# Patient Record
Sex: Male | Born: 1955 | Race: White | Hispanic: No | Marital: Married | Smoking: Never smoker
Health system: Southern US, Community
[De-identification: ages and names within clinical notes are randomized; demographics above are authoritative.]

## PROBLEM LIST (undated history)

## (undated) DIAGNOSIS — E559 Vitamin D deficiency, unspecified: Secondary | ICD-10-CM

## (undated) DIAGNOSIS — Z8709 Personal history of other diseases of the respiratory system: Secondary | ICD-10-CM

## (undated) DIAGNOSIS — M545 Low back pain, unspecified: Secondary | ICD-10-CM

## (undated) DIAGNOSIS — M1990 Unspecified osteoarthritis, unspecified site: Secondary | ICD-10-CM

## (undated) DIAGNOSIS — Z9889 Other specified postprocedural states: Secondary | ICD-10-CM

## (undated) DIAGNOSIS — N401 Enlarged prostate with lower urinary tract symptoms: Secondary | ICD-10-CM

## (undated) DIAGNOSIS — E785 Hyperlipidemia, unspecified: Secondary | ICD-10-CM

## (undated) DIAGNOSIS — Z860100 Personal history of colon polyps, unspecified: Secondary | ICD-10-CM

## (undated) DIAGNOSIS — M5416 Radiculopathy, lumbar region: Secondary | ICD-10-CM

## (undated) DIAGNOSIS — E049 Nontoxic goiter, unspecified: Secondary | ICD-10-CM

## (undated) DIAGNOSIS — R200 Anesthesia of skin: Secondary | ICD-10-CM

## (undated) DIAGNOSIS — K4090 Unilateral inguinal hernia, without obstruction or gangrene, not specified as recurrent: Secondary | ICD-10-CM

## (undated) DIAGNOSIS — T4145XA Adverse effect of unspecified anesthetic, initial encounter: Secondary | ICD-10-CM

## (undated) DIAGNOSIS — M7520 Bicipital tendinitis, unspecified shoulder: Secondary | ICD-10-CM

## (undated) DIAGNOSIS — K6289 Other specified diseases of anus and rectum: Secondary | ICD-10-CM

## (undated) DIAGNOSIS — R202 Paresthesia of skin: Secondary | ICD-10-CM

## (undated) DIAGNOSIS — T8859XA Other complications of anesthesia, initial encounter: Secondary | ICD-10-CM

## (undated) DIAGNOSIS — R112 Nausea with vomiting, unspecified: Secondary | ICD-10-CM

## (undated) DIAGNOSIS — L0292 Furuncle, unspecified: Secondary | ICD-10-CM

## (undated) DIAGNOSIS — M7541 Impingement syndrome of right shoulder: Secondary | ICD-10-CM

## (undated) HISTORY — PX: COLONOSCOPY: SHX174

## (undated) HISTORY — PX: INGUINAL HERNIA REPAIR: SUR1180

---
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
## 2001-11-27 HISTORY — PX: THYROIDECTOMY, PARTIAL: SHX18

## 2018-02-21 ENCOUNTER — Ambulatory Visit (INDEPENDENT_AMBULATORY_CARE_PROVIDER_SITE_OTHER): Payer: Self-pay | Admitting: Orthopaedic Surgery

## 2018-02-21 ENCOUNTER — Encounter (INDEPENDENT_AMBULATORY_CARE_PROVIDER_SITE_OTHER): Payer: Self-pay | Admitting: Orthopaedic Surgery

## 2018-02-21 DIAGNOSIS — M1611 Unilateral primary osteoarthritis, right hip: Secondary | ICD-10-CM | POA: Diagnosis not present

## 2018-02-21 DIAGNOSIS — M25551 Pain in right hip: Secondary | ICD-10-CM | POA: Diagnosis not present

## 2018-02-21 NOTE — Progress Notes (Signed)
Office Visit Note   Patient: Seth Wood           Date of Birth: [birth year]           MRN: [id number] Visit Date: 02/21/2018              Requested by: No referring provider defined for this encounter. PCP: Katherine Basset, PA-C   Assessment & Plan: Visit Diagnoses:  1. Unilateral primary osteoarthritis, right hip   2. Pain of right hip joint     Plan: At this point I agree that he is failed all conservative treatment measures and his arthritis is severe that I would recommend hip replacement surgery.  I talked about anterior hip replacement surgery and gave him a handout about it.  I showed him hip models and went over in detail with the surgery involves including long and thorough discussion the risk and benefits of surgery and what his intraoperative and postoperative course involved.  All questions and concerns were answered and addressed.  I gave him our surgery schedulers card so he will call us when he decides to have this scheduled.   Follow-Up Instructions: Return for 2 weeks post-op.   Orders:  No orders of the defined types were placed in this encounter.  No orders of the defined types were placed in this encounter.     Procedures: No procedures performed   Clinical Data: No additional findings.   Subjective: Chief Complaint  Patient presents with  . Right Hip - Pain  Patient is a very pleasant 62 year old gentleman with severe debilitating hip pain in his right hip.  He is been hurting for many years now and is been seeing another orthopedic surgeon who is recommended hip replacement surgery but he wants this done through an anterior approach and that surgeon on his posterior hip surgery.  He is been told he has bone-on-bone wear.  His pain is daily and is detrimentally affect is active daily living his quality of life and his mobility.  It is in the groin.  It hurts when he sleeps at night when he first gets up to walk on it.  He works in the school system  and would like to have surgery done in early June.  He is been on meloxicam daily and that helps take the edge off of things.  It is severe pain.  It is 10 out of 10.  He is tried activity modification and weight loss.  He is on anti-inflammatories.  Again this is been hurting for many years now.  X-rays accompany him today.  He is tried a cane on occasion as well.  He is not a diabetic and not a smoker.  HPI  Review of Systems He is alert and oriented x3 and in no acute distress.  He denies any headache, chest pain, shortness of breath, fever, chills, nausea, vomiting.  Objective: Vital Signs: There were no vitals taken for this visit.  Physical Exam  Ortho Exam He walks with a Trendelenburg gait.  He has significant stiffness with internal/external rotation of the right hip I can barely move it.  His pain is severe in the groin.  He has better range of motion of his left hip but there is pain in the groin on the left hip as well. Specialty Comments:  No specialty comments available.  Imaging: No results found. X-rays that accompany him that are independently reviewed shows severe end-stage arthritis of the right hip and mild to moderate  arthritis left hip.  The right hip has complete obliteration of loss of the joint space.  It is the essential absence of bone-on-bone wear.  There are particular osteophytes and severe sclerotic and cystic changes on both the femoral head and acetabular side of things and again he cannot even see the joint space.  PMFS History: Patient Active Problem List   Diagnosis Date Noted  . Unilateral primary osteoarthritis, right hip 02/21/2018   History reviewed. No pertinent past medical history.  History reviewed. No pertinent family history.  History reviewed. No pertinent surgical history. Social History   Occupational History  . Not on file  Tobacco Use  . Smoking status: Not on file  Substance and Sexual Activity  . Alcohol use: Not on file  .  Drug use: Not on file  . Sexual activity: Not on file

## 2018-03-07 ENCOUNTER — Other Ambulatory Visit (INDEPENDENT_AMBULATORY_CARE_PROVIDER_SITE_OTHER): Payer: Self-pay

## 2018-03-11 NOTE — Pre-Procedure Instructions (Signed)
Seth GowerBenny R Wood  03/11/2018      [facility] [address] - [place], [place] - [address], [id number], [address] [place] [id number] Phone: [phone] Fax: [phone]    Your procedure is scheduled on April 23  [facility] at 1200 P.M.  Call this number if you have problems the morning of surgery:  [phone]   Remember:  Do not eat food or drink liquids after midnight.  Continue all medications as directed by your physician except follow these medication instructions before surgery below   Take these medicines the morning of surgery with A SIP OF WATER NONE  7 days prior to surgery STOP taking any Aspirin(unless otherwise instructed by your surgeon), Aleve, Naproxen, Ibuprofen, Motrin, Advil, Goody's, BC's, all herbal medications, fish oil, and all vitamins, meloxicam (MOBIC)     Do not wear jewelry  Do not wear lotions, powders, or cologne, or deodorant.  Men may shave face and neck.  Do not bring valuables to the hospital.  [facility] is not [facility] for any belongings or valuables.  Contacts, dentures or bridgework may not be worn into surgery.  Leave your suitcase in the car.  After surgery it may be brought to your room.  For patients admitted to the hospital, discharge time will be determined by your treatment team.  Patients discharged the day of surgery will not be allowed to drive home.    Special instructions:   [facility]- Preparing For Surgery  Before surgery, you can play an important role. Because skin is not sterile, your skin needs to be as free of germs as possible. You can reduce the number of germs on your skin by washing with CHG (chlorahexidine gluconate) Soap before surgery.  CHG is an antiseptic cleaner which kills germs and bonds with the skin to continue killing germs even after washing.  Please do not use if you have an allergy to CHG or antibacterial soaps. If your skin becomes  reddened/irritated stop using the CHG.  Do not shave (including legs and underarms) for at least 48 hours prior to first CHG shower. It is OK to shave your face.  Please follow these instructions carefully.   1. Shower the NIGHT BEFORE SURGERY and the MORNING OF SURGERY with CHG.   2. If you chose to wash your hair, wash your hair first as usual with your normal shampoo.  3. After you shampoo, rinse your hair and body thoroughly to remove the shampoo.  4. Use CHG as you would any other liquid soap. You can apply CHG directly to the skin and wash gently with a scrungie or a clean washcloth.   5. Apply the CHG Soap to your body ONLY FROM THE NECK DOWN.  Do not use on open wounds or open sores. Avoid contact with your eyes, ears, mouth and genitals (private parts). Wash Face and genitals (private parts)  with your normal soap.  6. Wash thoroughly, paying special attention to the area where your surgery will be performed.  7. Thoroughly rinse your body with warm water from the neck down.  8. DO NOT shower/wash with your normal soap after using and rinsing off the CHG Soap.  9. Pat yourself dry with a CLEAN TOWEL.  10. Wear CLEAN PAJAMAS to bed the night before surgery, wear comfortable clothes the morning of surgery  11. Place CLEAN SHEETS on your bed the night of your first shower and DO NOT SLEEP  WITH PETS.    Day of Surgery: Do not apply any deodorants/lotions. Please wear clean clothes to the hospital/surgery center.      Please read over the following fact sheets that you were given.

## 2018-03-12 ENCOUNTER — Encounter (HOSPITAL_COMMUNITY): Payer: Self-pay

## 2018-03-12 ENCOUNTER — Encounter (HOSPITAL_COMMUNITY)
Admission: RE | Admit: 2018-03-12 | Discharge: 2018-03-12 | Disposition: A | Discharge status: Home / Self Care | Payer: BC Managed Care – PPO | Source: Ambulatory Visit | Attending: Orthopaedic Surgery | Admitting: Orthopaedic Surgery

## 2018-03-12 ENCOUNTER — Other Ambulatory Visit (INDEPENDENT_AMBULATORY_CARE_PROVIDER_SITE_OTHER): Payer: Self-pay | Admitting: Physician Assistant

## 2018-03-12 ENCOUNTER — Other Ambulatory Visit: Payer: Self-pay

## 2018-03-12 DIAGNOSIS — Z01812 Encounter for preprocedural laboratory examination: Secondary | ICD-10-CM | POA: Diagnosis present

## 2018-03-12 HISTORY — DX: Other complications of anesthesia, initial encounter: T88.59XA

## 2018-03-12 HISTORY — DX: Other specified postprocedural states: Z98.890

## 2018-03-12 HISTORY — DX: Adverse effect of unspecified anesthetic, initial encounter: T41.45XA

## 2018-03-12 HISTORY — DX: Nausea with vomiting, unspecified: R11.2

## 2018-03-12 LAB — CBC
HEMATOCRIT: 44.3 % (ref 39.0–52.0)
HEMOGLOBIN: 15.1 g/dL (ref 13.0–17.0)
MCH: 30.2 pg (ref 26.0–34.0)
MCHC: 34.1 g/dL (ref 30.0–36.0)
MCV: 88.6 fL (ref 78.0–100.0)
Platelets: 332 10*3/uL (ref 150–400)
RBC: 5 MIL/uL (ref 4.22–5.81)
RDW: 12.6 % (ref 11.5–15.5)
WBC: 8.1 10*3/uL (ref 4.0–10.5)

## 2018-03-12 LAB — SURGICAL PCR SCREEN
MRSA, PCR: NEGATIVE
Staphylococcus aureus: NEGATIVE

## 2018-03-12 NOTE — Progress Notes (Addendum)
PCP: Katherine Bassethristopher Cowen, PA-C  Cardiologist: pt denies  EKG: pt denies past year  Stress test: pt denies ever  ECHO: pt denies ever  Cardiac Cath: pt denies ever  Chest x-ray: pt denies past year, no recent respiratory infections/complications

## 2018-03-18 MED ORDER — CEFAZOLIN SODIUM-DEXTROSE 2-4 GM/100ML-% IV SOLN
2.0000 g | INTRAVENOUS | Status: AC
  Administered 2018-03-19: 2 g via INTRAVENOUS
  Filled 2018-03-18: qty 100

## 2018-03-18 MED ORDER — TRANEXAMIC ACID 1000 MG/10ML IV SOLN
1000.0000 mg | INTRAVENOUS | Status: AC
  Administered 2018-03-19: 1000 mg via INTRAVENOUS
  Filled 2018-03-18: qty 1100

## 2018-03-19 ENCOUNTER — Inpatient Hospital Stay (HOSPITAL_COMMUNITY): Payer: BC Managed Care – PPO

## 2018-03-19 ENCOUNTER — Telehealth (INDEPENDENT_AMBULATORY_CARE_PROVIDER_SITE_OTHER): Payer: Self-pay | Admitting: Radiology

## 2018-03-19 ENCOUNTER — Inpatient Hospital Stay (HOSPITAL_COMMUNITY): Payer: BC Managed Care – PPO | Admitting: Certified Registered"

## 2018-03-19 ENCOUNTER — Other Ambulatory Visit: Payer: Self-pay

## 2018-03-19 ENCOUNTER — Encounter (HOSPITAL_COMMUNITY): Payer: Self-pay | Admitting: *Deleted

## 2018-03-19 ENCOUNTER — Encounter (HOSPITAL_COMMUNITY)
Admission: RE | Disposition: A | Discharge status: Home Health | Payer: Self-pay | Source: Ambulatory Visit | Attending: Orthopaedic Surgery

## 2018-03-19 ENCOUNTER — Inpatient Hospital Stay (HOSPITAL_COMMUNITY)
Admission: RE | Admit: 2018-03-19 | Discharge: 2018-03-21 | DRG: 470 | Disposition: A | Discharge status: Home Health | Payer: BC Managed Care – PPO | Source: Ambulatory Visit | Attending: Orthopaedic Surgery | Admitting: Orthopaedic Surgery

## 2018-03-19 DIAGNOSIS — E669 Obesity, unspecified: Secondary | ICD-10-CM | POA: Diagnosis present

## 2018-03-19 DIAGNOSIS — Z419 Encounter for procedure for purposes other than remedying health state, unspecified: Secondary | ICD-10-CM

## 2018-03-19 DIAGNOSIS — Z6835 Body mass index (BMI) 35.0-35.9, adult: Secondary | ICD-10-CM | POA: Diagnosis not present

## 2018-03-19 DIAGNOSIS — M1611 Unilateral primary osteoarthritis, right hip: Secondary | ICD-10-CM | POA: Diagnosis present

## 2018-03-19 DIAGNOSIS — M25551 Pain in right hip: Secondary | ICD-10-CM | POA: Diagnosis present

## 2018-03-19 DIAGNOSIS — Z96641 Presence of right artificial hip joint: Secondary | ICD-10-CM

## 2018-03-19 HISTORY — PX: TOTAL HIP ARTHROPLASTY: SHX124

## 2018-03-19 HISTORY — DX: Nontoxic goiter, unspecified: E04.9

## 2018-03-19 SURGERY — ARTHROPLASTY, HIP, TOTAL, ANTERIOR APPROACH
Anesthesia: Spinal | Laterality: Right

## 2018-03-19 MED ORDER — MENTHOL 3 MG MT LOZG: 1.0000 | LOZENGE | OROMUCOSAL | Status: DC | PRN

## 2018-03-19 MED ORDER — OXYCODONE HCL 5 MG PO TABS
ORAL_TABLET | ORAL | Status: AC
  Filled 2018-03-19: qty 2

## 2018-03-19 MED ORDER — METOCLOPRAMIDE HCL 5 MG PO TABS: 5.0000 mg | ORAL_TABLET | Freq: Three times a day (TID) | ORAL | Status: DC | PRN

## 2018-03-19 MED ORDER — BUPIVACAINE IN DEXTROSE 0.75-8.25 % IT SOLN
INTRATHECAL | Status: DC | PRN
  Administered 2018-03-19: 1.8 mL via INTRATHECAL

## 2018-03-19 MED ORDER — ACETAMINOPHEN 325 MG PO TABS
325.0000 mg | ORAL_TABLET | Freq: Four times a day (QID) | ORAL | Status: DC | PRN
  Administered 2018-03-20 – 2018-03-21 (×4): 650 mg via ORAL
  Filled 2018-03-19 (×4): qty 2

## 2018-03-19 MED ORDER — ONDANSETRON HCL 4 MG/2ML IJ SOLN: 4.0000 mg | Freq: Four times a day (QID) | INTRAMUSCULAR | Status: DC | PRN

## 2018-03-19 MED ORDER — MEPERIDINE HCL 50 MG/ML IJ SOLN: 6.2500 mg | INTRAMUSCULAR | Status: DC | PRN

## 2018-03-19 MED ORDER — FENTANYL CITRATE (PF) 250 MCG/5ML IJ SOLN
INTRAMUSCULAR | Status: AC
  Filled 2018-03-19: qty 5

## 2018-03-19 MED ORDER — PROPOFOL 10 MG/ML IV BOLUS
INTRAVENOUS | Status: DC | PRN
  Administered 2018-03-19: 40 mg via INTRAVENOUS

## 2018-03-19 MED ORDER — HYDROMORPHONE HCL 2 MG/ML IJ SOLN
0.5000 mg | INTRAMUSCULAR | Status: DC | PRN
  Filled 2018-03-19: qty 1

## 2018-03-19 MED ORDER — 0.9 % SODIUM CHLORIDE (POUR BTL) OPTIME
TOPICAL | Status: DC | PRN
  Administered 2018-03-19: 1000 mL

## 2018-03-19 MED ORDER — DEXTROSE 5 % IV SOLN
INTRAVENOUS | Status: DC | PRN
  Administered 2018-03-19: 25 ug/min via INTRAVENOUS

## 2018-03-19 MED ORDER — ONDANSETRON HCL 4 MG/2ML IJ SOLN
INTRAMUSCULAR | Status: DC | PRN
  Administered 2018-03-19: 4 mg via INTRAVENOUS

## 2018-03-19 MED ORDER — OXYCODONE HCL 5 MG PO TABS
5.0000 mg | ORAL_TABLET | ORAL | Status: DC | PRN
  Administered 2018-03-19: 10 mg via ORAL

## 2018-03-19 MED ORDER — ONDANSETRON HCL 4 MG PO TABS: 4.0000 mg | ORAL_TABLET | Freq: Four times a day (QID) | ORAL | Status: DC | PRN

## 2018-03-19 MED ORDER — ASPIRIN 81 MG PO CHEW
81.0000 mg | CHEWABLE_TABLET | Freq: Two times a day (BID) | ORAL | Status: DC
  Administered 2018-03-20 – 2018-03-21 (×3): 81 mg via ORAL
  Filled 2018-03-19 (×3): qty 1

## 2018-03-19 MED ORDER — KETOROLAC TROMETHAMINE 30 MG/ML IJ SOLN
INTRAMUSCULAR | Status: AC
  Filled 2018-03-19: qty 1

## 2018-03-19 MED ORDER — KETOROLAC TROMETHAMINE 30 MG/ML IJ SOLN
30.0000 mg | Freq: Once | INTRAMUSCULAR | Status: AC | PRN
  Administered 2018-03-19: 30 mg via INTRAVENOUS

## 2018-03-19 MED ORDER — METHOCARBAMOL 1000 MG/10ML IJ SOLN
500.0000 mg | Freq: Four times a day (QID) | INTRAVENOUS | Status: DC | PRN
  Filled 2018-03-19: qty 5

## 2018-03-19 MED ORDER — ALUM & MAG HYDROXIDE-SIMETH 200-200-20 MG/5ML PO SUSP: 30.0000 mL | ORAL | Status: DC | PRN

## 2018-03-19 MED ORDER — MIDAZOLAM HCL 2 MG/2ML IJ SOLN
INTRAMUSCULAR | Status: DC | PRN
  Administered 2018-03-19: 2 mg via INTRAVENOUS

## 2018-03-19 MED ORDER — HYDROMORPHONE HCL 2 MG/ML IJ SOLN: 0.2500 mg | INTRAMUSCULAR | Status: DC | PRN

## 2018-03-19 MED ORDER — SODIUM CHLORIDE 0.9 % IV SOLN: INTRAVENOUS | Status: DC

## 2018-03-19 MED ORDER — GLYCOPYRROLATE 0.2 MG/ML IV SOSY
PREFILLED_SYRINGE | INTRAVENOUS | Status: DC | PRN
  Administered 2018-03-19: .2 mg via INTRAVENOUS

## 2018-03-19 MED ORDER — DIPHENHYDRAMINE HCL 12.5 MG/5ML PO ELIX: 12.5000 mg | ORAL_SOLUTION | ORAL | Status: DC | PRN

## 2018-03-19 MED ORDER — DOCUSATE SODIUM 100 MG PO CAPS
100.0000 mg | ORAL_CAPSULE | Freq: Two times a day (BID) | ORAL | Status: DC
  Administered 2018-03-20 – 2018-03-21 (×3): 100 mg via ORAL
  Filled 2018-03-19 (×3): qty 1

## 2018-03-19 MED ORDER — CHLORHEXIDINE GLUCONATE 4 % EX LIQD: 60.0000 mL | Freq: Once | CUTANEOUS | Status: DC

## 2018-03-19 MED ORDER — POLYETHYLENE GLYCOL 3350 17 G PO PACK: 17.0000 g | PACK | Freq: Every day | ORAL | Status: DC | PRN

## 2018-03-19 MED ORDER — MIDAZOLAM HCL 2 MG/2ML IJ SOLN
INTRAMUSCULAR | Status: AC
  Filled 2018-03-19: qty 2

## 2018-03-19 MED ORDER — SODIUM CHLORIDE 0.9 % IR SOLN
Status: DC | PRN
  Administered 2018-03-19: 3000 mL

## 2018-03-19 MED ORDER — PROMETHAZINE HCL 25 MG/ML IJ SOLN: 6.2500 mg | INTRAMUSCULAR | Status: DC | PRN

## 2018-03-19 MED ORDER — PANTOPRAZOLE SODIUM 40 MG PO TBEC
40.0000 mg | DELAYED_RELEASE_TABLET | Freq: Every day | ORAL | Status: DC
  Administered 2018-03-20 – 2018-03-21 (×2): 40 mg via ORAL
  Filled 2018-03-19 (×2): qty 1

## 2018-03-19 MED ORDER — DEXAMETHASONE SODIUM PHOSPHATE 10 MG/ML IJ SOLN
INTRAMUSCULAR | Status: DC | PRN
  Administered 2018-03-19: 10 mg via INTRAVENOUS

## 2018-03-19 MED ORDER — METHOCARBAMOL 500 MG PO TABS
500.0000 mg | ORAL_TABLET | Freq: Four times a day (QID) | ORAL | Status: DC | PRN
  Administered 2018-03-19 – 2018-03-21 (×4): 500 mg via ORAL
  Filled 2018-03-19 (×3): qty 1

## 2018-03-19 MED ORDER — PHENOL 1.4 % MT LIQD: 1.0000 | OROMUCOSAL | Status: DC | PRN

## 2018-03-19 MED ORDER — CEFAZOLIN SODIUM-DEXTROSE 2-4 GM/100ML-% IV SOLN
2.0000 g | Freq: Four times a day (QID) | INTRAVENOUS | Status: AC
  Administered 2018-03-20 (×2): 2 g via INTRAVENOUS
  Filled 2018-03-19 (×2): qty 100

## 2018-03-19 MED ORDER — METOCLOPRAMIDE HCL 5 MG/ML IJ SOLN: 5.0000 mg | Freq: Three times a day (TID) | INTRAMUSCULAR | Status: DC | PRN

## 2018-03-19 MED ORDER — PROPOFOL 500 MG/50ML IV EMUL
INTRAVENOUS | Status: DC | PRN
  Administered 2018-03-19: 16:00:00 via INTRAVENOUS
  Administered 2018-03-19: 100 ug/kg/min via INTRAVENOUS

## 2018-03-19 MED ORDER — OXYCODONE HCL 5 MG PO TABS: 10.0000 mg | ORAL_TABLET | ORAL | Status: DC | PRN

## 2018-03-19 MED ORDER — FENTANYL CITRATE (PF) 250 MCG/5ML IJ SOLN
INTRAMUSCULAR | Status: DC | PRN
  Administered 2018-03-19: 50 ug via INTRAVENOUS

## 2018-03-19 MED ORDER — LACTATED RINGERS IV SOLN
INTRAVENOUS | Status: DC
  Administered 2018-03-19 (×2): via INTRAVENOUS

## 2018-03-19 MED ORDER — METHOCARBAMOL 500 MG PO TABS
ORAL_TABLET | ORAL | Status: AC
  Filled 2018-03-19: qty 1

## 2018-03-19 SURGICAL SUPPLY — 53 items
BENZOIN TINCTURE PRP APPL 2/3 (GAUZE/BANDAGES/DRESSINGS) ×3 IMPLANT
BLADE CLIPPER SURG (BLADE) IMPLANT
BLADE SAW SGTL 18X1.27X75 (BLADE) ×2 IMPLANT
BLADE SAW SGTL 18X1.27X75MM (BLADE) ×1
CAPT HIP TOTAL 2 ×3 IMPLANT
CELLS DAT CNTRL 66122 CELL SVR (MISCELLANEOUS) ×1 IMPLANT
CLOSURE WOUND 1/2 X4 (GAUZE/BANDAGES/DRESSINGS) ×2
COVER LIGHT HANDLE STERIS (MISCELLANEOUS) ×3 IMPLANT
COVER SURGICAL LIGHT HANDLE (MISCELLANEOUS) ×3 IMPLANT
DRAPE C-ARM 42X72 X-RAY (DRAPES) ×3 IMPLANT
DRAPE STERI IOBAN 125X83 (DRAPES) ×3 IMPLANT
DRAPE U-SHAPE 47X51 STRL (DRAPES) ×9 IMPLANT
DRSG AQUACEL AG ADV 3.5X10 (GAUZE/BANDAGES/DRESSINGS) ×3 IMPLANT
DURAPREP 26ML APPLICATOR (WOUND CARE) ×3 IMPLANT
ELECT BLADE 4.0 EZ CLEAN MEGAD (MISCELLANEOUS) ×3
ELECT BLADE 6.5 EXT (BLADE) IMPLANT
ELECT REM PT RETURN 9FT ADLT (ELECTROSURGICAL) ×3
ELECTRODE BLDE 4.0 EZ CLN MEGD (MISCELLANEOUS) ×1 IMPLANT
ELECTRODE REM PT RTRN 9FT ADLT (ELECTROSURGICAL) ×1 IMPLANT
FACESHIELD WRAPAROUND (MASK) ×6 IMPLANT
GAUZE XEROFORM 1X8 LF (GAUZE/BANDAGES/DRESSINGS) ×3 IMPLANT
GLOVE BIOGEL PI IND STRL 8 (GLOVE) ×2 IMPLANT
GLOVE BIOGEL PI INDICATOR 8 (GLOVE) ×4
GLOVE ECLIPSE 8.0 STRL XLNG CF (GLOVE) ×3 IMPLANT
GLOVE ORTHO TXT STRL SZ7.5 (GLOVE) ×6 IMPLANT
GOWN STRL REUS W/ TWL LRG LVL3 (GOWN DISPOSABLE) ×2 IMPLANT
GOWN STRL REUS W/ TWL XL LVL3 (GOWN DISPOSABLE) ×2 IMPLANT
GOWN STRL REUS W/TWL LRG LVL3 (GOWN DISPOSABLE) ×4
GOWN STRL REUS W/TWL XL LVL3 (GOWN DISPOSABLE) ×4
HANDPIECE INTERPULSE COAX TIP (DISPOSABLE) ×2
KIT BASIN OR (CUSTOM PROCEDURE TRAY) ×3 IMPLANT
KIT TURNOVER KIT B (KITS) ×3 IMPLANT
MANIFOLD NEPTUNE II (INSTRUMENTS) ×3 IMPLANT
NS IRRIG 1000ML POUR BTL (IV SOLUTION) ×3 IMPLANT
PACK TOTAL JOINT (CUSTOM PROCEDURE TRAY) ×3 IMPLANT
PAD ARMBOARD 7.5X6 YLW CONV (MISCELLANEOUS) ×3 IMPLANT
RTRCTR WOUND ALEXIS 18CM MED (MISCELLANEOUS) ×3
SET HNDPC FAN SPRY TIP SCT (DISPOSABLE) ×1 IMPLANT
STAPLER VISISTAT 35W (STAPLE) ×3 IMPLANT
STRIP CLOSURE SKIN 1/2X4 (GAUZE/BANDAGES/DRESSINGS) ×4 IMPLANT
SUT ETHIBOND NAB CT1 #1 30IN (SUTURE) ×3 IMPLANT
SUT MNCRL AB 4-0 PS2 18 (SUTURE) IMPLANT
SUT VIC AB 0 CT1 27 (SUTURE) ×2
SUT VIC AB 0 CT1 27XBRD ANBCTR (SUTURE) ×1 IMPLANT
SUT VIC AB 1 CT1 27 (SUTURE) ×4
SUT VIC AB 1 CT1 27XBRD ANBCTR (SUTURE) ×2 IMPLANT
SUT VIC AB 2-0 CT1 27 (SUTURE) ×2
SUT VIC AB 2-0 CT1 TAPERPNT 27 (SUTURE) ×1 IMPLANT
TOWEL OR 17X24 6PK STRL BLUE (TOWEL DISPOSABLE) ×3 IMPLANT
TOWEL OR 17X26 10 PK STRL BLUE (TOWEL DISPOSABLE) ×3 IMPLANT
TRAY CATH 16FR W/PLASTIC CATH (SET/KITS/TRAYS/PACK) IMPLANT
TRAY FOLEY W/METER SILVER 16FR (SET/KITS/TRAYS/PACK) IMPLANT
WATER STERILE IRR 1000ML POUR (IV SOLUTION) ×6 IMPLANT

## 2018-03-19 NOTE — Telephone Encounter (Signed)
Patient emailed to ask which vehicle he should have available to drive him home postop.  Dr Magnus IvanBlackman advises either of his vehicles are ok. Terri called yesterday and LMVM at home advising.

## 2018-03-19 NOTE — Anesthesia Preprocedure Evaluation (Signed)
Anesthesia Evaluation  Patient identified by MRN, date of birth, ID band Patient awake    Reviewed: Allergy & Precautions, NPO status , Patient's Chart, lab work & pertinent test results  History of Anesthesia Complications (+) PONV and history of anesthetic complications  Airway Mallampati: I       Dental no notable dental hx. (+) Teeth Intact   Pulmonary neg pulmonary ROS,    Pulmonary exam normal breath sounds clear to auscultation       Cardiovascular negative cardio ROS Normal cardiovascular exam Rhythm:Regular Rate:Normal     Neuro/Psych negative neurological ROS  negative psych ROS   GI/Hepatic negative GI ROS, Neg liver ROS,   Endo/Other  negative endocrine ROS  Renal/GU negative Renal ROS  negative genitourinary   Musculoskeletal   Abdominal (+) + obese,   Peds  Hematology   Anesthesia Other Findings   Reproductive/Obstetrics                             Anesthesia Physical Anesthesia Plan  ASA: II  Anesthesia Plan: Spinal   Post-op Pain Management:    Induction:   PONV Risk Score and Plan: 3 and Ondansetron and Dexamethasone  Airway Management Planned: Natural Airway and Simple Face Mask  Additional Equipment:   Intra-op Plan:   Post-operative Plan:   Informed Consent: I have reviewed the patients History and Physical, chart, labs and discussed the procedure including the risks, benefits and alternatives for the proposed anesthesia with the patient or authorized representative who has indicated his/her understanding and acceptance.     Plan Discussed with: CRNA and Surgeon  Anesthesia Plan Comments:         Anesthesia Quick Evaluation

## 2018-03-19 NOTE — Transfer of Care (Signed)
Immediate Anesthesia Transfer of Care Note  Patient: Seth Wood  Procedure(s) Performed: RIGHT TOTAL HIP ARTHROPLASTY ANTERIOR APPROACH (Right )  Patient Location: PACU  Anesthesia Type:MAC and Spinal  Level of Consciousness: drowsy  Airway & Oxygen Therapy: Patient Spontanous Breathing  Post-op Assessment: Report given to RN and Post -op Vital signs reviewed and stable  Post vital signs: Reviewed and stable  Last Vitals:  Vitals Value Taken Time  BP 111/66 03/19/2018  4:28 PM  Temp 36.6 C 03/19/2018  4:28 PM  Pulse 64 03/19/2018  4:34 PM  Resp 18 03/19/2018  4:34 PM  SpO2 93 % 03/19/2018  4:34 PM  Vitals shown include unvalidated device data.  Last Pain:  Vitals:   03/19/18 1630  TempSrc:   PainSc: (P) Asleep      Patients Stated Pain Goal: 3 (55/73/22 0254)  Complications: No apparent anesthesia complications

## 2018-03-19 NOTE — Progress Notes (Signed)
Pt c/o pain in lower back. Initially said it felt better after po OXY IR & Robaxin, and repositioning. Now feels like it's worse. Dr Noreene LarssonJoslin fully updated-said Dr Jean RosenthalJackson will be over to see pt. Pt does not want further pain med till seen by [facility]. Says back pain feels better with HOB elevated 40 degrees. Will continue to monitor.

## 2018-03-19 NOTE — H&P (Signed)
TOTAL HIP ADMISSION H&P  Patient is admitted for right total hip arthroplasty.  Subjective:  Chief Complaint: right hip pain  HPI: Seth Wood, 62 y.o. male, has a history of pain and functional disability in the right hip(s) due to arthritis and patient has failed non-surgical conservative treatments for greater than 12 weeks to include NSAID's and/or analgesics, corticosteriod injections, flexibility and strengthening excercises, use of assistive devices, weight reduction as appropriate and activity modification.  Onset of symptoms was gradual starting 3 years ago with gradually worsening course since that time.The patient noted no past surgery on the right hip(s).  Patient currently rates pain in the right hip at 10 out of 10 with activity. Patient has night pain, worsening of pain with activity and weight bearing, trendelenberg gait, pain that interfers with activities of daily living and pain with passive range of motion. Patient has evidence of subchondral sclerosis, periarticular osteophytes and joint space narrowing by imaging studies. This condition presents safety issues increasing the risk of falls.  There is no current active infection.  Patient Active Problem List   Diagnosis Date Noted  . Unilateral primary osteoarthritis, right hip 02/21/2018   Past Medical History:  Diagnosis Date  . Arthritis   . Complication of anesthesia    pt had too much anesthesia with hernia surgery and had nausea/vomiting  . PONV (postoperative nausea and vomiting)     Past Surgical History:  Procedure Laterality Date  . COLONOSCOPY    . goiter     removed in 2003 from neck  . HERNIA REPAIR     inguinal hernia  . repair of torn ligament     left leg in high school    Current Facility-Administered Medications  Medication Dose Route Frequency Provider Last Rate Last Dose  . ceFAZolin (ANCEF) IVPB 2g/100 mL premix  2 g Intravenous To SS-Surg Kathryne Hitch, MD      . chlorhexidine  (HIBICLENS) 4 % liquid 4 application  60 mL Topical Once Richardean Canal W, PA-C      . tranexamic acid (CYKLOKAPRON) 1,000 mg in sodium chloride 0.9 % 100 mL IVPB  1,000 mg Intravenous To OR Kathryne Hitch, MD       No Known Allergies  Social History   Tobacco Use  . Smoking status: Never Smoker  . Smokeless tobacco: Never Used  Substance Use Topics  . Alcohol use: Not Currently    Frequency: Never    No family history on file.   Review of Systems  Musculoskeletal: Positive for joint pain.  All other systems reviewed and are negative.   Objective:  Physical Exam  Constitutional: He is oriented to person, place, and time. He appears well-developed and well-nourished.  HENT:  Head: Normocephalic and atraumatic.  Eyes: Pupils are equal, round, and reactive to light.  Neck: Normal range of motion.  Cardiovascular: Normal rate.  Respiratory: Effort normal and breath sounds normal.  GI: Soft.  Musculoskeletal:       Right hip: He exhibits decreased range of motion, decreased strength, tenderness and bony tenderness.  Neurological: He is alert and oriented to person, place, and time.  Skin: Skin is warm.  Psychiatric: He has a normal mood and affect.    Vital signs in last 24 hours: Temp:  [98.7 F (37.1 C)] 98.7 F (37.1 C) (04/23 1153) Pulse Rate:  [93] 93 (04/23 1153) Resp:  [18] 18 (04/23 1153) BP: (183)/(93) 183/93 (04/23 1153) SpO2:  [95 %] 95 % (04/23 1153)  Weight:  [239 lb 1.6 oz (108.5 kg)] 239 lb 1.6 oz (108.5 kg) (04/23 1153)  Labs:   Estimated body mass index is 35.05 kg/m as calculated from the following:   Height as of this encounter: 5' 9.25" (1.759 m).   Weight as of this encounter: 239 lb 1.6 oz (108.5 kg).   Imaging Review Plain radiographs demonstrate severe degenerative joint disease of the right hip(s). The bone quality appears to be good for age and reported activity level.    Preoperative templating of the joint replacement has  been completed, documented, and submitted to the Operating Room personnel in order to optimize intra-operative equipment management.  Assessment/Plan:  End stage arthritis, right hip(s)  The patient history, physical examination, clinical judgement of the provider and imaging studies are consistent with end stage degenerative joint disease of the right hip(s) and total hip arthroplasty is deemed medically necessary. The treatment options including medical management, injection therapy, arthroscopy and arthroplasty were discussed at length. The risks and benefits of total hip arthroplasty were presented and reviewed. The risks due to aseptic loosening, infection, stiffness, dislocation/subluxation,  thromboembolic complications and other imponderables were discussed.  The patient acknowledged the explanation, agreed to proceed with the plan and consent was signed. Patient is being admitted for inpatient treatment for surgery, pain control, PT, OT, prophylactic antibiotics, VTE prophylaxis, progressive ambulation and ADL's and discharge planning.The patient is planning to be discharged home with home health services

## 2018-03-19 NOTE — Anesthesia Procedure Notes (Signed)
Spinal  Patient location during procedure: OR Start time: 03/19/2018 2:30 PM End time: 03/19/2018 2:33 PM Staffing Anesthesiologist: Leilani AbleHatchett, Laverta Harnisch, MD Performed: anesthesiologist  Preanesthetic Checklist Completed: patient identified, site marked, surgical consent, pre-op evaluation, timeout performed, IV checked, risks and benefits discussed and monitors and equipment checked Spinal Block Patient position: sitting Prep: site prepped and draped and DuraPrep Patient monitoring: continuous pulse ox and blood pressure Approach: midline Location: L3-4 Injection technique: single-shot Needle Needle type: Pencan  Needle gauge: 24 G Needle length: 10 cm Needle insertion depth: 5 cm Assessment Sensory level: T6

## 2018-03-19 NOTE — Anesthesia Procedure Notes (Addendum)
Procedure Name: MAC Date/Time: 03/19/2018 2:42 PM Performed by: Imagene Riches, CRNA Pre-anesthesia Checklist: Patient identified, Emergency Drugs available, Suction available, Patient being monitored and Timeout performed Patient Re-evaluated:Patient Re-evaluated prior to induction Oxygen Delivery Method: Simple face mask Preoxygenation: Pre-oxygenation with 100% oxygen Induction Type: IV induction

## 2018-03-19 NOTE — Brief Op Note (Signed)
03/19/2018  4:25 PM  PATIENT:  Seth Wood  62 y.o. male  PRE-OPERATIVE DIAGNOSIS:  right hip osteoarthritis  POST-OPERATIVE DIAGNOSIS:  right hip osteoarthritis  PROCEDURE:  Procedure(s): RIGHT TOTAL HIP ARTHROPLASTY ANTERIOR APPROACH (Right)  SURGEON:  Surgeon(s) and Role:    Kathryne Hitch* Tami Blass Y, MD - Primary  PHYSICIAN ASSISTANT: Rexene EdisonGil Clark, PA-C  ANESTHESIA:   spinal  EBL:  300 mL   COUNTS:  YES  DICTATION: .Other Dictation: Dictation Number [id number]  PLAN OF CARE: Admit to inpatient   PATIENT DISPOSITION:  PACU - hemodynamically stable.   Delay start of Pharmacological VTE agent (>24hrs) due to surgical blood loss or risk of bleeding: no

## 2018-03-20 ENCOUNTER — Encounter (HOSPITAL_COMMUNITY): Payer: Self-pay | Admitting: Orthopaedic Surgery

## 2018-03-20 LAB — CBC
HCT: 40.1 % (ref 39.0–52.0)
Hemoglobin: 13.9 g/dL (ref 13.0–17.0)
MCH: 30.9 pg (ref 26.0–34.0)
MCHC: 34.7 g/dL (ref 30.0–36.0)
MCV: 89.1 fL (ref 78.0–100.0)
Platelets: 333 10*3/uL (ref 150–400)
RBC: 4.5 MIL/uL (ref 4.22–5.81)
RDW: 12.8 % (ref 11.5–15.5)
WBC: 14.4 10*3/uL — ABNORMAL HIGH (ref 4.0–10.5)

## 2018-03-20 LAB — BASIC METABOLIC PANEL
Anion gap: 9 (ref 5–15)
BUN: 13 mg/dL (ref 6–20)
CALCIUM: 9 mg/dL (ref 8.9–10.3)
CO2: 24 mmol/L (ref 22–32)
CREATININE: 0.8 mg/dL (ref 0.61–1.24)
Chloride: 101 mmol/L (ref 101–111)
GFR calc non Af Amer: >60 mL/min (ref >60)
GLUCOSE: 151 mg/dL — AB (ref 65–99)
Potassium: 4.1 mmol/L (ref 3.5–5.1)
Sodium: 134 mmol/L — ABNORMAL LOW (ref 135–145)

## 2018-03-20 NOTE — Evaluation (Signed)
Physical Therapy Evaluation Patient Details Name: Seth Wood MRN: [id number] DOB: 02/22/1956 Today's Date: 03/20/2018   History of Present Illness  62 y.o. male admitted on 03/19/18 for elective R direct anterior THA. WBAT post op.  Pt with significant PMH of  goiter, arthritis, and thyroidectomy.  Clinical Impression  Pt is POD #1 and is moving well.  He was min guard to supervision overall with gait down the hallway with RW.  HEP exercise review initiated.  Pt will need to practice stairs this PM before potential d/c home and practice standing exercises.   PT to follow acutely for deficits listed below.       Follow Up Recommendations Follow surgeon's recommendation for DC plan and follow-up therapies;Supervision for mobility/OOB    Equipment Recommendations  Rolling walker with 5" wheels;3in1 (PT)    Recommendations for Other Services   NA    Precautions / Restrictions Precautions Precautions: None Restrictions Weight Bearing Restrictions: Yes LLE Weight Bearing: Weight bearing as tolerated      Mobility  Bed Mobility Overal bed mobility: Needs Assistance Bed Mobility: Supine to Sit     Supine to sit: Min assist;HOB elevated     General bed mobility comments: Min assist to help progress right leg over EOB.   Transfers Overall transfer level: Needs assistance   Transfers: Sit to/from Stand Sit to Stand: Min guard         General transfer comment: min guard assist for safety, verbal cues for safe hand placement.   Ambulation/Gait Ambulation/Gait assistance: Supervision Ambulation Distance (Feet): 200 Feet Assistive device: Rolling walker (2 wheeled) Gait Pattern/deviations: Step-through pattern;Antalgic     General Gait Details: Mildly antalgic gait pattern, verbal cues for upright posture.  Tennis shoes donned for gait.                 Pertinent Vitals/Pain Pain Assessment: 0-10 Pain Score: 3  Pain Location: right hip and thigh Pain  Descriptors / Indicators: Aching Pain Intervention(s): Limited activity within patient's tolerance;Monitored during session;Repositioned;Ice applied    Home Living Family/patient expects to be discharged to:: Private residence Living Arrangements: Spouse/significant other Available Help at Discharge: Family;Available 24 hours/day Type of Home: House Home Access: Stairs to enter Entrance Stairs-Rails: None Entrance Stairs-Number of Steps: 2(small steps) Home Layout: One level Home Equipment: Cane - single point Additional Comments: Pt sleeps in a recliner    Prior Function Level of Independence: Independent         Comments: drives, works full time desk mostly        Extremity/Trunk Assessment   Upper Extremity Assessment Upper Extremity Assessment: Defer to OT evaluation    Lower Extremity Assessment Lower Extremity Assessment: RLE deficits/detail RLE Deficits / Details: right leg with normal post op pain and weakness, ankle at least 3/5, knee 3/5, hip 2/5    Cervical / Trunk Assessment Cervical / Trunk Assessment: Other exceptions Cervical / Trunk Exceptions: Pt with h/o low back pain/arthritis  Communication   Communication: No difficulties  Cognition Arousal/Alertness: Awake/alert Behavior During Therapy: WFL for tasks assessed/performed Overall Cognitive Status: Within Functional Limits for tasks assessed                                           Exercises Total Joint Exercises Ankle Circles/Pumps: AROM;Both;10 reps Quad Sets: AROM;Right;10 reps Heel Slides: AAROM;Right;10 reps Hip ABduction/ADduction: AAROM;Right;10 reps Long Arc Quad: AROM;Right;10  reps   Assessment/Plan    PT Assessment Patient needs continued PT services  PT Problem List Decreased strength;Decreased range of motion;Decreased balance;Decreased activity tolerance;Decreased mobility;Decreased knowledge of use of DME;Pain       PT Treatment Interventions DME  instruction;Gait training;Stair training;Functional mobility training;Therapeutic activities;Therapeutic exercise;Balance training;Patient/family education;Manual techniques;Modalities    PT Goals (Current goals can be found in the Care Plan section)  Acute Rehab PT Goals Patient Stated Goal: to get back to work and driving, walking normally PT Goal Formulation: With patient Time For Goal Achievement: 03/27/18 Potential to Achieve Goals: Good    Frequency 7X/week           AM-PAC PT "6 Clicks" Daily Activity  Outcome Measure Difficulty turning over in bed (including adjusting bedclothes, sheets and blankets)?: Unable Difficulty moving from lying on back to sitting on the side of the bed? : Unable Difficulty sitting down on and standing up from a chair with arms (e.g., wheelchair, bedside commode, etc,.)?: Unable Help needed moving to and from a bed to chair (including a wheelchair)?: A Little Help needed walking in hospital room?: A Little Help needed climbing 3-5 steps with a railing? : A Little 6 Click Score: 12    End of Session   Activity Tolerance: Patient limited by pain Patient left: in chair;with call bell/phone within reach Nurse Communication: Mobility status PT Visit Diagnosis: Difficulty in walking, not elsewhere classified (R26.2);Muscle weakness (generalized) (M62.81);Pain Pain - Right/Left: Right Pain - part of body: Leg    Time: 1030-1101 PT Time Calculation (min) (ACUTE ONLY): 31 min   Charges:          Lurena Joiner B. Matyas Baisley, PT, DPT [phone]   PT Evaluation $PT Eval Moderate Complexity: 1 Mod PT Treatments $Gait Training: 8-22 mins

## 2018-03-20 NOTE — Op Note (Signed)
NAMETRAMANE, GORUM                       ACCOUNT NO.:  MEDICAL RECORD NO.:  [id number]  LOCATION:                                 FACILITY:  PHYSICIAN:  Vanita Panda. 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  PROCEDURE:  Right total hip arthroplasty through direct anterior approach.  IMPLANTS:  DePuy Sector Gription acetabular component size 56, size 36 +0 polyethylene liner, size 15 Corail femoral component with varus offset, size 36 +5 ceramic hip ball.  SURGEON:  Vanita Panda. Magnus Ivan, M.D.  ASSISTANT:  Richardean Canal, PA-C.  ANESTHESIA:  Spinal.  ANTIBIOTICS:  3 g of IV Ancef.  BLOOD LOSS:  300 cc.  COMPLICATIONS:  None.  INDICATIONS:  The patient is a very pleasant 62 year old gentleman with severe debilitating arthritis involving his right hip, this was well documented with x-rays and physical exam.  At this point, he has tried and failed all forms of conservative treatment and does wish to proceed with the total hip arthroplasty given his daily pain and the severity of it.  His pain is also detrimentally affecting his activities of daily living, his quality of life and his mobility.  At this point, he does wish to proceed with a total hip arthroplasty through direct anterior approach.  He understands fully the risks of acute blood loss anemia, nerve and vessel injury, fracture, infection, dislocation, and DVT.  He understands our goals are to decrease pain, improve mobility, and overall improved quality of life.  PROCEDURE DESCRIPTION:  After informed consent was obtained, appropriate right hip was marked.  He was brought to the operating room where spinal anesthesia was  obtained while he was on a stretcher.  Foley catheter was placed and then both feet had traction boots applied to them.  Next, he was placed supine on the Hana fracture table with a perineal post in place and both legs in inline skeletal traction devices, but no traction applied.  His right operative hip was prepped and draped with DuraPrep and sterile drapes.  A time-out was called and he was identified as correct patient and correct right hip.  We then made an incision just inferior and posterior to the anterior superior iliac spine and carried this obliquely down the leg.  We dissected down the tensor fascia lata muscle.  The tensor fascia was then divided longitudinally to proceed with a direct anterior approach to the hip.  We identified and cauterized the circumflex vessels and then identified the hip capsule. I opened up the hip capsule in an L-type format, finding a very large joint effusion and significant arthritis involving his right femoral head.  We placed Cobra retractors around the medial and lateral femoral neck and then made our femoral neck cut with an oscillating saw proximal to the lesser trochanter and completed this with an osteotome.  I placed a corkscrew guide in the femoral head and removed the femoral head  in its entirety and found it flattened and significantly devoid of cartilage.  We then placed a bent Hohmann over the medial acetabular rim and removed periarticular osteophytes and other debris from the acetabulum including the acetabular labrum.  We then began reaming sequentially from size 43 reamer in stepwise increments all the way up to a size 55 with all reamers under direct visualization, the last reamer under direct fluoroscopy, so I could obtain my depth of reaming, my inclination and anteversion.  Once we were pleased with this, we placed the real DePuy Sector Gription acetabular component size 56 and a 36 +0 polyethylene liner for that size  acetabular component.  Of note, preoperatively started with equal leg lengths, we then externally rotated the femur to 120 degrees, extended and adducted.  We placed a Mueller retractor medially and a Hohmann retractor behind the greater trochanter.  We released the lateral joint capsule and used a box cutting osteotome to enter the femoral canal and a rongeur to lateralize.  We then began broaching from a size 8 broach using the Corail broaching system going up to a size 15.  With the 15 in place, we trialed a varus offset femoral neck based off his anatomy and a 36 +1.5 hip ball.  We brought the leg back over and up with traction and internal rotation reducing the pelvis, and it was definitely little unstable with external rotation and I felt a little short as well with needing more offset.  We dislocated the hip and removed the trial components.  We were able to place the real Corail femoral component with varus offset, size 15 and then a 36 +5 ceramic hip ball.  We reduced this in the acetabulum and we appreciated stability, leg length and offset better, which was improved.  We then irrigated the soft tissue with normal saline solution using pulsatile lavage.  We closed the joint capsule with interrupted #1 Ethibond suture followed by running #1 Vicryl in the tensor fascia, 0 Vicryl in the deep tissue, 2-0 Vicryl in the subcutaneous tissue, interrupted staples on the skin. Xeroform and Aquacel dressing were applied.  He was taken off the Hana table, taken to the recovery room in stable condition.  All final counts were correct.  There were no complications noted.  Of note, Richardean CanalGilbert Clark, PA-C, assisted in the entire case.  His assistance was crucial for facilitating all aspects of this case.     Vanita Pandahristopher Y. Magnus IvanBlackman, M.D.     CYB/MEDQ  D:  03/19/2018  T:  03/19/2018  Job:  [id number]

## 2018-03-20 NOTE — Progress Notes (Signed)
Subjective: 1 Day Post-Op Procedure(s) (LRB): RIGHT TOTAL HIP ARTHROPLASTY ANTERIOR APPROACH (Right) Patient reports pain as moderate.    Objective: Vital signs in last 24 hours: Temp:  [97.8 F (36.6 C)-99 F (37.2 C)] 97.8 F (36.6 C) (04/24 0327) Pulse Rate:  [59-93] 79 (04/24 0327) Resp:  [15-22] 15 (04/24 0327) BP: (100-183)/(64-93) 115/77 (04/24 0327) SpO2:  [91 %-97 %] 96 % (04/24 0327) Weight:  [239 lb 1.6 oz (108.5 kg)] 239 lb 1.6 oz (108.5 kg) (04/23 1153)  Intake/Output from previous day: 04/23 0701 - 04/24 0700 In: 1420 [P.O.:220; I.V.:1200] Out: 1950 [Urine:1650; Blood:300] Intake/Output this shift: No intake/output data recorded.  No results for input(s): HGB in the last 72 hours. No results for input(s): WBC, RBC, HCT, PLT in the last 72 hours. No results for input(s): NA, K, CL, CO2, BUN, CREATININE, GLUCOSE, CALCIUM in the last 72 hours. No results for input(s): LABPT, INR in the last 72 hours.  Sensation intact distally Intact pulses distally Dorsiflexion/Plantar flexion intact Incision: dressing C/D/I  Assessment/Plan: 1 Day Post-Op Procedure(s) (LRB): RIGHT TOTAL HIP ARTHROPLASTY ANTERIOR APPROACH (Right) Up with therapy Discharge home with home health next 1-2 days.    Kathryne HitchChristopher Y Breydan Shillingburg 03/20/2018, 7:03 AM

## 2018-03-20 NOTE — Care Management Note (Signed)
Case Management Note  Patient Details  Name: Seth Wood MRN: [id number] Date of Birth: February 21, 1956  Subjective/Objective:   62 yr old gentleman s/p right total hip arthroplasty, anterior approach.                 Action/Plan: Case manager spoke with patient and wife concerning discharge plan and DME. Patient was preoperatively setup with [facility], no changes. Will have family support at discharge.    Expected Discharge Date:    03/21/18              Expected Discharge Plan:  Home w Home Health Services  In-House Referral:  NA  Discharge planning Services  CM Consult  Post Acute Care Choice:  Durable Medical Equipment, Home Health Choice offered to:  Patient, Spouse  DME Arranged:  3-N-1, Walker rolling DME Agency:  [facility]  HH Arranged:  PT, OT HH Agency:  [facility]  Status of Service:  Completed, signed off  If discussed at [employer] Meetings, dates discussed:    Additional Comments:  Durenda GuthrieBrady, Dmarion Perfect Naomi, RN 03/20/2018, 2:20 PM

## 2018-03-20 NOTE — Evaluation (Signed)
Occupational Therapy Evaluation Patient Details Name: Seth GowerBenny R Rudin MRN: [id number] DOB: 07/10/1956 Today's Date: 03/20/2018    History of Present Illness 62 y.o. male admitted on 03/19/18 for elective R direct anterior THA. WBAT post op.  Pt with significant PMH of  goiter, arthritis, and thyroidectomy.   Clinical Impression   PATIENT WAS SEEN FOR SKILLED OT TO EDUCATE PATIENT AND WIFE TO INCREASE ABILITY WITH ADLS AND MOBILITY. PATIENT REQUIRES ASSIST WITH LE ADLS BUT DOES NOT WANT AE. PATIENT WIFE STATES SHE WILL ASSIST AS NEEDED. PATIENT WAS EDUCATED TO KICK SX LEG OUT FOR SIT TO STAND AND STAND TO SIT TO INCREASE COMFORT LEVEL. PATIENT WAS S WITH AMB TO COMMODE WITH WALK AND PERFORMING TRANSFER. PATIENT IS GOING TO RECEIVE HHOT AND NEEDS EDUCATION ON PROPER TUB TRANSFER. DISCUSSED WITH PATIENT AND WIFE THAT THEY MAY NEED TO INSTALL GRAB BARS.     Follow Up Recommendations  Home health OT    Equipment Recommendations  None recommended by OT    Recommendations for Other Services       Precautions / Restrictions Precautions Precautions: None Restrictions Weight Bearing Restrictions: Yes LLE Weight Bearing: Weight bearing as tolerated      Mobility Bed Mobility Overal bed mobility: Needs Assistance Bed Mobility: Supine to Sit     Supine to sit: Min assist;HOB elevated     General bed mobility comments: Min assist to help progress right leg over EOB.   Transfers Overall transfer level: Needs assistance   Transfers: Sit to/from Stand Sit to Stand: Supervision         General transfer comment: min guard assist for safety, verbal cues for safe hand placement.     Balance                                           ADL either performed or assessed with clinical judgement   ADL                                               Vision Baseline Vision/History: No visual deficits       Perception     Praxis      Pertinent  Vitals/Pain Pain Assessment: 0-10 Pain Score: 4  Pain Location: right hip and thigh Pain Descriptors / Indicators: Aching;Constant Pain Intervention(s): Limited activity within patient's tolerance;Monitored during session     Hand Dominance     Extremity/Trunk Assessment Upper Extremity Assessment Upper Extremity Assessment: Overall WFL for tasks assessed   Lower Extremity Assessment Lower Extremity Assessment: RLE deficits/detail RLE Deficits / Details: right leg with normal post op pain and weakness, ankle at least 3/5, knee 3/5, hip 2/5   Cervical / Trunk Assessment Cervical / Trunk Assessment: Other exceptions Cervical / Trunk Exceptions: Pt with h/o low back pain/arthritis   Communication Communication Communication: No difficulties   Cognition Arousal/Alertness: Awake/alert Behavior During Therapy: WFL for tasks assessed/performed Overall Cognitive Status: Within Functional Limits for tasks assessed                                     General Comments       Exercises Exercises: Total Joint Total Joint Exercises Ankle  Circles/Pumps: AROM;Both;10 reps Quad Sets: AROM;Right;10 reps Heel Slides: AAROM;Right;10 reps Hip ABduction/ADduction: AAROM;Right;10 reps Long Arc Quad: AROM;Right;10 reps   Shoulder Instructions      Home Living Family/patient expects to be discharged to:: Private residence Living Arrangements: Spouse/significant other Available Help at Discharge: Family;Available 24 hours/day Type of Home: House Home Access: Stairs to enter [employer] of Steps: 2(small steps) Entrance Stairs-Rails: None Home Layout: One level     Bathroom Shower/Tub: Chief Strategy Officer: Standard     Home Equipment: Cane - single point   Additional Comments: Pt sleeps in a recliner      Prior Functioning/Environment Level of Independence: Independent        Comments: drives, works full time desk mostly        OT  Problem List:        OT Treatment/Interventions:      OT Goals(Current goals can be found in the care plan section) Acute Rehab OT Goals Patient Stated Goal: to go home Potential to Achieve Goals: Good  OT Frequency:     Barriers to D/C:            Co-evaluation              AM-PAC PT "6 Clicks" Daily Activity     Outcome Measure Help from another person eating meals?: None Help from another person taking care of personal grooming?: None Help from another person toileting, which includes using toliet, bedpan, or urinal?: A Little Help from another person bathing (including washing, rinsing, drying)?: A Little Help from another person to put on and taking off regular upper body clothing?: None Help from another person to put on and taking off regular lower body clothing?: A Lot 6 Click Score: 20   End of Session Equipment Utilized During Treatment: Gait belt;Rolling walker  Activity Tolerance: Patient tolerated treatment well Patient left: in chair;with call bell/phone within reach;with family/visitor present                   Time: 1610-9604 OT Time Calculation (min): 38 min Charges:  OT General Charges $OT Visit: 1 Visit OT Evaluation $OT Eval Low Complexity: 1 Low OT Treatments $Self Care/Home Management : 23-37 mins G-Codes:     6 CLICKS  Patric Buckhalter 03/20/2018, 1:31 PM

## 2018-03-20 NOTE — Progress Notes (Signed)
Physical Therapy Treatment Patient Details Name: Seth Wood MRN: [id number] DOB: [birth year] Today's Date: 03/20/2018    History of Present Illness 62 y.o. male admitted on 03/19/18 for elective R direct anterior THA. WBAT post op.  Pt with significant PMH of  goiter, arthritis, and thyroidectomy.    PT Comments    Pt remains concerned re: R thigh pain, but did well progressing gait, exercises and completing stair training.  He reports he will be here another night.  Wife present for this session.  PT to follow acutely until d/c confirmed.      Follow Up Recommendations  Follow surgeon's recommendation for DC plan and follow-up therapies;Supervision for mobility/OOB     Equipment Recommendations  Rolling walker with 5" wheels;3in1 (PT)    Recommendations for Other Services   NA     Precautions / Restrictions Precautions Precautions: None Restrictions LLE Weight Bearing: Weight bearing as tolerated    Mobility  Bed Mobility               General bed mobility comments: Pt was OOB in the recliner chair.   Transfers Overall transfer level: Needs assistance Equipment used: Rolling walker (2 wheeled) Transfers: Sit to/from Stand Sit to Stand: Supervision         General transfer comment: supervision for safety, verbal cues for safe hand placement and slw speed.    Ambulation/Gait Ambulation/Gait assistance: Supervision Ambulation Distance (Feet): 150 Feet Assistive device: Rolling walker (2 wheeled) Gait Pattern/deviations: Step-through pattern;Antalgic Gait velocity: decreased   General Gait Details: Pt with flexed hip posture, he is likely really tight PTA in his hip flexors.   Stairs Stairs: Yes Stairs assistance: Supervision Stair Management: Two rails;Step to pattern;Forwards Number of Stairs: 5(x2) General stair comments: verbal cues for correct LE sequencing, supervision for safety.           Cognition Arousal/Alertness: Awake/alert Behavior  During Therapy: WFL for tasks assessed/performed Overall Cognitive Status: Within Functional Limits for tasks assessed                                        Exercises Total Joint Exercises Ankle Circles/Pumps: AROM;Both;10 reps Quad Sets: AROM;Right;10 reps Heel Slides: AAROM;Right;10 reps Hip ABduction/ADduction: AAROM;Right;10 reps;AROM;Seated;Standing Long Arc Quad: AROM;Right;10 reps Knee Flexion: AROM;Right;10 reps;Standing Marching in Standing: AROM;Right;10 reps;Standing Standing Hip Extension: AROM;Right;10 reps;Standing        Pertinent Vitals/Pain Pain Assessment: 0-10 Pain Score: 6  Pain Location: right hip and thigh Pain Descriptors / Indicators: Aching;Constant Pain Intervention(s): Limited activity within patient's tolerance;Monitored during session;Repositioned           PT Goals (current goals can now be found in the care plan section) Acute Rehab PT Goals Patient Stated Goal: to go home Progress towards PT goals: Progressing toward goals    Frequency    7X/week      PT Plan Current plan remains appropriate       AM-PAC PT "6 Clicks" Daily Activity  Outcome Measure  Difficulty turning over in bed (including adjusting bedclothes, sheets and blankets)?: Unable Difficulty moving from lying on back to sitting on the side of the bed? : Unable Difficulty sitting down on and standing up from a chair with arms (e.g., wheelchair, bedside commode, etc,.)?: Unable Help needed moving to and from a bed to chair (including a wheelchair)?: A Little Help needed walking in hospital room?: A Little Help needed  climbing 3-5 steps with a railing? : A Little 6 Click Score: 12    End of Session   Activity Tolerance: Patient limited by pain Patient left: in chair;with call bell/phone within reach;with family/visitor present Nurse Communication: Mobility status PT Visit Diagnosis: Difficulty in walking, not elsewhere classified (R26.2);Muscle  weakness (generalized) (M62.81);Pain Pain - Right/Left: Right Pain - part of body: Leg     Time: 1448-1530 PT Time Calculation (min) (ACUTE ONLY): 42 min  Charges:  $Gait Training: 23-37 mins $Therapeutic Exercise: 8-22 mins          Khadim Lundberg B. Leonardo Makris, PT, DPT [phone]            03/20/2018, 4:26 PM

## 2018-03-21 LAB — CBC
HCT: 37.3 % — ABNORMAL LOW (ref 39.0–52.0)
Hemoglobin: 12.4 g/dL — ABNORMAL LOW (ref 13.0–17.0)
MCH: 29.7 pg (ref 26.0–34.0)
MCHC: 33.2 g/dL (ref 30.0–36.0)
MCV: 89.4 fL (ref 78.0–100.0)
Platelets: 249 10*3/uL (ref 150–400)
RBC: 4.17 MIL/uL — ABNORMAL LOW (ref 4.22–5.81)
RDW: 12.9 % (ref 11.5–15.5)
WBC: 12.1 10*3/uL — ABNORMAL HIGH (ref 4.0–10.5)

## 2018-03-21 MED ORDER — METHOCARBAMOL 500 MG PO TABS: 500.0000 mg | ORAL_TABLET | Freq: Four times a day (QID) | ORAL | 0 refills | Status: DC | PRN

## 2018-03-21 MED ORDER — ACETAMINOPHEN 325 MG PO TABS
325.0000 mg | ORAL_TABLET | Freq: Four times a day (QID) | ORAL | 0 refills | Status: AC | PRN
Start: 2018-03-21 — End: ?

## 2018-03-21 MED ORDER — ASPIRIN 81 MG PO CHEW
81.0000 mg | CHEWABLE_TABLET | Freq: Two times a day (BID) | ORAL | 0 refills | Status: AC
Start: 2018-03-21 — End: ?

## 2018-03-21 NOTE — Discharge Instructions (Signed)

## 2018-03-21 NOTE — Plan of Care (Signed)
  Problem: Education: Goal: Knowledge of General Education information will improve Outcome: Adequate for Discharge   

## 2018-03-21 NOTE — Progress Notes (Signed)
Patient ID: Seth GowerBenny R Karnik, male   DOB: 1956/03/09, 62 y.o.   MRN: [id number] Doing well overall.  Can be discharged to home today.

## 2018-03-21 NOTE — Progress Notes (Signed)
Pt given discharge instructions and gone over with him and wife present. Answered all questions to satisfaction. Equipment and belongings gathered to be sent home with him. Pt in no distress at time of discharge. Wife driving him home.

## 2018-03-21 NOTE — Discharge Summary (Signed)
Patient ID: Seth Wood MRN: [id number] DOB/AGE: 02-Jul-1956 62 y.o.  Admit date: 03/19/2018 Discharge date: 03/21/2018  Admission Diagnoses:  Principal Problem:   Unilateral primary osteoarthritis, right hip Active Problems:   Status post total replacement of right hip   Discharge Diagnoses:  Same  Past Medical History:  Diagnosis Date  . Arthritis    "right hip" (03/19/2018)  . Complication of anesthesia    pt had too much anesthesia with hernia surgery and had nausea/vomiting  . Goiter    hx  . PONV (postoperative nausea and vomiting)     Surgeries: Procedure(s): RIGHT TOTAL HIP ARTHROPLASTY ANTERIOR APPROACH on 03/19/2018   Consultants:   Discharged Condition: Improved  Hospital Course: Seth GowerBenny R Cropley is an 62 y.o. male who was admitted 03/19/2018 for operative treatment ofUnilateral primary osteoarthritis, right hip. Patient has severe unremitting pain that affects sleep, daily activities, and work/hobbies. After pre-op clearance the patient was taken to the operating room on 03/19/2018 and underwent  Procedure(s): RIGHT TOTAL HIP ARTHROPLASTY ANTERIOR APPROACH.    Patient was given perioperative antibiotics:  Anti-infectives (From admission, onward)   Start     Dose/Rate Route Frequency Ordered Stop   03/19/18 2300  ceFAZolin (ANCEF) IVPB 2g/100 mL premix     2 g 200 mL/hr over 30 Minutes Intravenous Every 6 hours 03/19/18 2253 03/20/18 0747   03/19/18 1300  ceFAZolin (ANCEF) IVPB 2g/100 mL premix     2 g 200 mL/hr over 30 Minutes Intravenous To ShortStay [facility] 03/18/18 0915 03/19/18 1430       Patient was given sequential compression devices, early ambulation, and chemoprophylaxis to prevent DVT.  Patient benefited maximally from hospital stay and there were no complications.    Recent vital signs:  Patient Vitals for the past 24 hrs:  BP Temp Temp src Pulse SpO2  03/21/18 0420 118/64 97.9 F (36.6 C) Oral 69 96 %  03/20/18 1934 127/82 98.4 F (36.9 C)  Oral 70 97 %  03/20/18 1404 131/73 97.9 F (36.6 C) Oral 81 96 %     Recent laboratory studies:  Recent Labs    03/20/18 0605 03/21/18 0615  WBC 14.4* 12.1*  HGB 13.9 12.4*  HCT 40.1 37.3*  PLT 333 249  NA 134*  --   K 4.1  --   CL 101  --   CO2 24  --   BUN 13  --   CREATININE 0.80  --   GLUCOSE 151*  --   CALCIUM 9.0  --      Discharge Medications:   Allergies as of 03/21/2018   No Known Allergies     Medication List    TAKE these medications   acetaminophen 325 MG tablet Commonly known as:  TYLENOL Take 1-2 tablets (325-650 mg total) by mouth every 6 (six) hours as needed for mild pain (pain score 1-3 or temp > 100.5).   aspirin 81 MG chewable tablet Chew 1 tablet (81 mg total) by mouth 2 (two) times daily.   BIOTIN PO Take 1 tablet by mouth daily.   FISH OIL PO Take 1 capsule by mouth daily.   glucosamine-chondroitin 500-400 MG tablet Take 1 tablet by mouth once.   meloxicam 15 MG tablet Commonly known as:  MOBIC Take 15 mg by mouth daily.   methocarbamol 500 MG tablet Commonly known as:  ROBAXIN Take 1 tablet (500 mg total) by mouth every 6 (six) hours as needed for muscle spasms.   ONE-A-DAY 50 PLUS PO Take  1 tablet by mouth daily.   VITAMIN C PO Take 2 tablets by mouth daily.   VITAMIN D3 PO Take 1 capsule by mouth daily.            Durable Medical Equipment  (From admission, onward)        Start     Ordered   03/19/18 2254  DME 3 n 1  Once     03/19/18 2253   03/19/18 2254  DME Walker rolling  Once    Question:  Patient needs a walker to treat with the following condition  Answer:  Status post total replacement of right hip   03/19/18 2253      Diagnostic Studies: Dg Pelvis Portable  Result Date: 03/19/2018 CLINICAL DATA:  Status post right total hip joint prosthesis placement. EXAM: PORTABLE PELVIS 1-2 VIEWS COMPARISON:  Intraoperative fluoro spot views of today's date FINDINGS: The patient has undergone right total hip  joint prosthesis placement. Radiographic positioning of the prosthetic components is good. The interface with the native bone appears normal. There is skin staples laterally. IMPRESSION: There is no immediate postprocedure complication following right total hip joint prosthesis placement. Electronically Signed   By: David  [place] M.D.   On: 03/19/2018 16:53   Dg C-arm 1-60 Min  Result Date: 03/19/2018 CLINICAL DATA:  62 year old male post total right hip replacement. Initial encounter. EXAM: DG C-ARM 61-120 MIN; OPERATIVE RIGHT HIP WITH PELVIS Fluoroscopic time: 37 seconds. COMPARISON:  None. FINDINGS: Three intraoperative C-arm views submitted for review after surgery. Post total right hip replacement which appears in satisfactory position on frontal projection without complication noted. Left hip joint degenerative changes incompletely assessed. IMPRESSION: Post total right hip replacement. Electronically Signed   By: Lacy Duverney M.D.   On: 03/19/2018 16:33   Dg Hip Operative Unilat W Or W/o Pelvis Right  Result Date: 03/19/2018 CLINICAL DATA:  62 year old male post total right hip replacement. Initial encounter. EXAM: DG C-ARM 61-120 MIN; OPERATIVE RIGHT HIP WITH PELVIS Fluoroscopic time: 37 seconds. COMPARISON:  None. FINDINGS: Three intraoperative C-arm views submitted for review after surgery. Post total right hip replacement which appears in satisfactory position on frontal projection without complication noted. Left hip joint degenerative changes incompletely assessed. IMPRESSION: Post total right hip replacement. Electronically Signed   By: Lacy Duverney M.D.   On: 03/19/2018 16:33    Disposition: Discharge disposition: 01-Home or Self Care         Follow-up Information    [facility] Follow up.   Specialty:  Home Health Services Why:  A representative from [facility] will contact you to arrange start date and time for your therapy. Contact information: [address] [place] [address] [phone]        Kathryne Hitch, MD Follow up in 2 week(s).   Specialty:  Orthopedic Surgery Contact information: [address] [place] [address] [phone]            Signed: Kathryne Hitch 03/21/2018, 9:10 AM

## 2018-03-22 ENCOUNTER — Telehealth (INDEPENDENT_AMBULATORY_CARE_PROVIDER_SITE_OTHER): Payer: Self-pay | Admitting: Orthopaedic Surgery

## 2018-03-22 NOTE — Telephone Encounter (Signed)
Verbal order given  

## 2018-03-22 NOTE — Telephone Encounter (Signed)
Diane, PT with [facility] needs verbal orders for PT starting today   1 x for 1 week 2 x for 2 weeks 1 x for 2 weeks  CB# [phone]

## 2018-03-25 ENCOUNTER — Telehealth (INDEPENDENT_AMBULATORY_CARE_PROVIDER_SITE_OTHER): Payer: Self-pay

## 2018-03-25 LAB — GLUCOSE, CAPILLARY: Glucose-Capillary: 81 mg/dL (ref 65–99)

## 2018-03-25 NOTE — Telephone Encounter (Signed)
See below email from patient

## 2018-03-25 NOTE — Telephone Encounter (Signed)
Tell the patient that is not unusual to have bilateral foot and ankle swelling after any type of joint replacement surgery.  Use of the treatment is elevation periodically throughout the day and compressive hose such as TED hose.  He can get these from any drugstore.

## 2018-03-25 NOTE — Telephone Encounter (Signed)
In my previous response to your email in which I confirmed that I will be there on May 9 @ 2:15, I forgot to ask you to ask Dr. Magnus Ivan about something. I have been doing my physical therapy exercises regularly throughout each day, and I get up several times throughout each day to walk with my walker to stay active and to work it. Though I am very careful, I am able to do everything on my own: shower, dress, get up, sit down at my desk to study, get back up to sit to read, etc. Still, this morning while taking a shower and getting dressed, I noticed for the first time that my right ankle and right foot are swollen (the same side--right--of my right hip replacement). Is this swollenness due to the surgery "settling" in this area? Or is it because I am doing too much on it? Please ask Dr. Magnus Ivan.  Seth Wood

## 2018-03-26 NOTE — Anesthesia Postprocedure Evaluation (Signed)
Anesthesia Post Note  Patient: Seth Wood  Procedure(s) Performed: RIGHT TOTAL HIP ARTHROPLASTY ANTERIOR APPROACH (Right )     Patient location during evaluation: PACU Anesthesia Type: Spinal Level of consciousness: awake Pain management: pain level controlled Vital Signs Assessment: post-procedure vital signs reviewed and stable Respiratory status: spontaneous breathing Cardiovascular status: stable Postop Assessment: no headache, no backache, spinal receding, patient able to bend at knees and no apparent nausea or vomiting Anesthetic complications: no    Last Vitals:  Vitals:   03/20/18 1934 03/21/18 0420  BP: 127/82 118/64  Pulse: 70 69  Resp:    Temp: 36.9 C 36.6 C  SpO2: 97% 96%    Last Pain:  Vitals:   03/21/18 1007  TempSrc:   PainSc: 4    Pain Goal: Patients Stated Pain Goal: 3 (03/21/18 1007)               Jerrit Horen JR,JOHN Susann Givens

## 2018-03-26 NOTE — Telephone Encounter (Signed)
LMOM of below message  

## 2018-04-01 ENCOUNTER — Telehealth (INDEPENDENT_AMBULATORY_CARE_PROVIDER_SITE_OTHER): Payer: Self-pay

## 2018-04-01 NOTE — Telephone Encounter (Signed)
Patient response:  Thanks for responding. I can see why pain in the right groin area is normal with a right hip replacement of "a lot of hammering," but I hope this is true of the pain in the left groin area---left inner thigh area, or abductor muscles---where no left hip replacement took place.  I have an appointment with Dr. Magnus Ivan this Thursday at 2:15, so I will ask him then about this. Thanks for responding.  Seth Wood

## 2018-04-01 NOTE — Telephone Encounter (Signed)
Can you email this back to him? I called him, but he didn't answer

## 2018-04-01 NOTE — Telephone Encounter (Signed)
Please advise 

## 2018-04-01 NOTE — Telephone Encounter (Signed)
Emailed patient.

## 2018-04-01 NOTE — Telephone Encounter (Signed)
Just FYI.

## 2018-04-01 NOTE — Telephone Encounter (Signed)
Email from patient:  I hate to be a "thorn in anyone's flesh," but I want to inquire about something else. I had the total hip replacement 9 days ago. Before I had it, my right groin area really hurt at times, and I was told that this was normal. (Even my left one did also.) Once I had the surgery, however, I thought it would not hurt anymore. Yet it still hurts at times, or sporadically. And this is usually after I do my daily exercises for rehabing. I mean, in the mornings I basically do not feel any discomfort. Therefore, is this also normal?  Seth Wood

## 2018-04-01 NOTE — Telephone Encounter (Signed)
Pain in the groin is normal after hip replacement surgery due to the trauma of putting the hip socket component and which takes a lot of hammering.  This pain will subside with time.

## 2018-04-04 ENCOUNTER — Ambulatory Visit (INDEPENDENT_AMBULATORY_CARE_PROVIDER_SITE_OTHER): Payer: BC Managed Care – PPO | Admitting: Orthopaedic Surgery

## 2018-04-04 ENCOUNTER — Encounter (INDEPENDENT_AMBULATORY_CARE_PROVIDER_SITE_OTHER): Payer: Self-pay | Admitting: Orthopaedic Surgery

## 2018-04-04 DIAGNOSIS — Z96641 Presence of right artificial hip joint: Secondary | ICD-10-CM

## 2018-04-04 NOTE — Progress Notes (Signed)
The patient is 2 weeks status post a right total hip arthroplasty through direct anterior approach.  He is ambulating with a cane and only taking Tylenol for pain.  He is doing well otherwise and is been continuing home health therapy.  On exam his incision looks good except for a small area of the just very superior aspect is a little bit overall no evidence of infection at all.  I removed all staples and placed Steri-Strips.  At the top of that I will have him Place mupirocin ointment on it once a day after each shower.  Otherwise he seems to be doing well.  We talked about what he can and cannot do.  I would like to see him back in 4 weeks to see how is doing overall but no x-rays are needed.

## 2018-04-12 ENCOUNTER — Telehealth (INDEPENDENT_AMBULATORY_CARE_PROVIDER_SITE_OTHER): Payer: Self-pay

## 2018-04-12 NOTE — Telephone Encounter (Signed)
Emailed response to patient

## 2018-04-12 NOTE — Telephone Encounter (Signed)
Email from patient:  Dr. Magnus Ivan, I think I told you when you removed my staples exactly one week from today that I am going to do the physical therapy for just five weeks, which you concurred; for even into the third week of doing it, the therapist said I was doing really well, which is why I was able to walk into your office 2 1/2 weeks post-surgery with only a cane and no walker. Next week will be my fifth week of therapy, so next week the therapy will end. The therapist said this morning that sometime next week I probably will not be using the cane, albeit the therapist said that this is not to be forced but allowed to happen naturally. Anyway, although I believe I shared this with you 2 1/2 weeks ago, I am making sure you know this and thus want your confirmation that you do know this.  I will see you again on 5/30 for a check up on the hip and for a "return to work form" that I need for you to fax to my employer so I can return to work on 6/3, which will be six weeks after my surgery.  Seth Wood

## 2018-04-12 NOTE — Telephone Encounter (Signed)
Want to email him this back for me please

## 2018-04-12 NOTE — Telephone Encounter (Signed)
See below

## 2018-04-12 NOTE — Telephone Encounter (Signed)
Let him know I have seen his message and agree

## 2018-04-15 ENCOUNTER — Telehealth (INDEPENDENT_AMBULATORY_CARE_PROVIDER_SITE_OTHER): Payer: Self-pay

## 2018-04-15 NOTE — Telephone Encounter (Signed)
See below

## 2018-04-15 NOTE — Telephone Encounter (Signed)
E-mail from patient:   Dr. Magnus Ivan, tomorrow will be 4 weeks since my right hip replacement surgery. Hopefully, soon I will be able to walk without my cane: I am able to walk some without it, as I practice this by holding it off the ground while walking and putting it back down when necessary. And, oddly, I feel the strongest in being able to walk some without my cane RIGHT AFTER slowly rehabbing on my elliptical each day (I have built back up to only 3/4 of a mile). Still, I wish I did not have to use it at all, and I thought that around 4 weeks I would not have to use it. Frankly, I am scheduled to go back to work on 6/3 (which will be six weeks from surgery), but I am not going to go back if I am using the cane. 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 So I will just have to wait until next week to see about the cane, if I am still using it at the end of the week before 6/3.  Finally, today I mowed with my riding mower, which consists of sitting for 1 hour. My right hip felt pretty good while riding, getting on the mower, and getting off it. But like it did before I had my right hip surgery, my inner left thigh continues to hurt---while sitting on the mower (a lot) and even when getting on and off it.The physical therapist says it is my adductor muscle. There has been pain in this adductor muscle for probably two years. In the past year, both my PCP doctor and Orthopedic Dr. Ardis Hughs could not find anything after physically examining this left adductor muscle AND x-raying it. Anyway, today while riding on the mower, there was pain there again, and it seems like it was worse than ever. I told my wife this is discouraging, especially since some in the medical field said it would be better (even probably gone) after the right hip replacement. But it is NOT  better after the right hip replacement, and, again, I believe it might be worse---at least while sitting upright on my mower and at my desk. I guess when you see me again on 5/30 (next Thursday) for a post-surgery check-up on my right hip, I will get your feedback on this left inner thigh (adductor) muscle---what you think is causing the pain.  Seth Wood

## 2018-04-25 ENCOUNTER — Telehealth (INDEPENDENT_AMBULATORY_CARE_PROVIDER_SITE_OTHER): Payer: Self-pay | Admitting: Orthopaedic Surgery

## 2018-04-25 ENCOUNTER — Encounter (INDEPENDENT_AMBULATORY_CARE_PROVIDER_SITE_OTHER): Payer: Self-pay | Admitting: Orthopaedic Surgery

## 2018-04-25 ENCOUNTER — Other Ambulatory Visit (INDEPENDENT_AMBULATORY_CARE_PROVIDER_SITE_OTHER): Payer: Self-pay

## 2018-04-25 ENCOUNTER — Ambulatory Visit (INDEPENDENT_AMBULATORY_CARE_PROVIDER_SITE_OTHER): Payer: BC Managed Care – PPO | Admitting: Orthopaedic Surgery

## 2018-04-25 DIAGNOSIS — Z96641 Presence of right artificial hip joint: Secondary | ICD-10-CM

## 2018-04-25 DIAGNOSIS — R1032 Left lower quadrant pain: Secondary | ICD-10-CM

## 2018-04-25 DIAGNOSIS — M25552 Pain in left hip: Secondary | ICD-10-CM

## 2018-04-25 DIAGNOSIS — R1030 Lower abdominal pain, unspecified: Secondary | ICD-10-CM

## 2018-04-25 NOTE — Telephone Encounter (Signed)
Completed Job description form faxed to [facility] [phone].,YNWG faxed to [employer] @ [phone] per pats request

## 2018-04-25 NOTE — Progress Notes (Signed)
The patient is coming up on 6 weeks status post a right total hip arthroplasty through direct injury approach.  He says he is doing very well without right hip and he is ready return to work on Monday.  He has no issues with the right hip at all.  His left hip though is been having significant pain in the groin area after an injury using an elliptical exercise equipment months ago.  He has not been able get over that injury and he has severe inner thigh pain around the abductor muscles.  This is been evaluated x-rays which were normal.  We decided to give this a chance of just rest and anti-inflammatories and activity modification as well as therapy.  He still having severe pain in the left hip groin area and not on the right side at all.  On examination of his right hip it moves fluidly with no issues at all and his incisions well-healed.  His leg lengths are equal.  His left hip is assessed and he has severe pain in the abductor muscles and tendons areas which is quite significant.  At this point I do feel that he is fine to return to his light duty work on Monday which is mainly sedentary type of work.  However I do feel its medically necessary and warranted to obtain an MRI of his left hip to assess the tenderness and muscular structures to help better determine what treatment modalities are needed given the severity of his pain in the very conservative treatment.  We will see him back after the MRI of his left hip.  All questions concerns were answered and addressed.

## 2018-04-25 NOTE — Telephone Encounter (Signed)
Patient want to schedule his MRI closer to where he live. Patient lives in [place]. The number to contact patient is [phone]

## 2018-04-29 ENCOUNTER — Telehealth (INDEPENDENT_AMBULATORY_CARE_PROVIDER_SITE_OTHER): Payer: Self-pay | Admitting: *Deleted

## 2018-04-29 NOTE — Telephone Encounter (Signed)
Pt is scheduled for MRI on Wed June 5 at 315pm at [facility].Left message on pt vm to return call for appt information.

## 2018-04-29 NOTE — Telephone Encounter (Signed)
noted 

## 2018-05-02 ENCOUNTER — Ambulatory Visit (INDEPENDENT_AMBULATORY_CARE_PROVIDER_SITE_OTHER): Payer: BC Managed Care – PPO | Admitting: Orthopaedic Surgery

## 2018-05-09 ENCOUNTER — Telehealth (INDEPENDENT_AMBULATORY_CARE_PROVIDER_SITE_OTHER): Payer: Self-pay | Admitting: Orthopaedic Surgery

## 2018-05-09 NOTE — Telephone Encounter (Signed)
Joyce GrossKay from [facility] left a message stating that the patient is scheduled today for an MRI of the left hip without contrast but they do not have the order, please fax it to [phone], CB#[phone].  Thank you.

## 2018-05-09 NOTE — Telephone Encounter (Signed)
Done, faxed to attn Manda at [phone]

## 2018-05-09 NOTE — Telephone Encounter (Signed)
Please advise 

## 2018-05-14 ENCOUNTER — Ambulatory Visit (INDEPENDENT_AMBULATORY_CARE_PROVIDER_SITE_OTHER): Payer: BC Managed Care – PPO | Admitting: Orthopaedic Surgery

## 2018-05-14 ENCOUNTER — Encounter (INDEPENDENT_AMBULATORY_CARE_PROVIDER_SITE_OTHER): Payer: Self-pay | Admitting: Orthopaedic Surgery

## 2018-05-14 ENCOUNTER — Telehealth (INDEPENDENT_AMBULATORY_CARE_PROVIDER_SITE_OTHER): Payer: Self-pay

## 2018-05-14 ENCOUNTER — Telehealth (INDEPENDENT_AMBULATORY_CARE_PROVIDER_SITE_OTHER): Payer: Self-pay | Admitting: Orthopaedic Surgery

## 2018-05-14 DIAGNOSIS — M25552 Pain in left hip: Secondary | ICD-10-CM

## 2018-05-14 DIAGNOSIS — Z96641 Presence of right artificial hip joint: Secondary | ICD-10-CM

## 2018-05-14 DIAGNOSIS — M1612 Unilateral primary osteoarthritis, left hip: Secondary | ICD-10-CM | POA: Insufficient documentation

## 2018-05-14 NOTE — Telephone Encounter (Signed)
Patient called left voicemail message stating  he has a couple more questions concerning his MRI. Patient asked if there was fluid on his right hip after surgery. Patient said he has pain in his inner thigh and if that would indicate that he has a hernia. The number to contact patient is [phone]

## 2018-05-14 NOTE — Progress Notes (Signed)
Patient is in postoperative follow-up 50+ days out from a right total hip arthroplasty.  Due to severe groin pain on his left side, we sent him for an MRI of that left hip based on plain film showing still a well-maintained joint space.  He is been having severe groin pain though and he felt was more along the hip abductor muscles.  On exam his right hip seems to be doing well.  His left hip still hurts in the groin area on extremes of rotation.  The MRI of his left hip shows significantly worse arthritis on that left side than what the plain films indicate.  There is areas of full-thickness cartilage loss and synovitis.  There is potential area of loose body and what the radiologist feels may be a subchondral fracture.  The muscles and tendons in the area are normal.  At this point I do feel he would benefit from a one-time intra-articular steroid injection under direct fluoroscopy in the left hip by Dr. Alvester MorinNewton.  He is 8 weeks out from his right total hip arthroplasty so I am fine with having a steroid injection on the left side.  I will see him back myself in 4 weeks to see how he is doing overall and in the interim both had an intervention by Dr. Alvester MorinNewton.  All questions concerns were answered and addressed.  No x-rays are needed in 4 weeks.

## 2018-05-14 NOTE — Telephone Encounter (Signed)
The MRI did not show any hernia.  As far as his right hip goes, it does show appropriate post-op fluid around his right hip that is seen normally after total hip replacement surgery.  This fluid will slowly resolve over the next few months.

## 2018-05-14 NOTE — Telephone Encounter (Signed)
Please advise 

## 2018-05-15 ENCOUNTER — Telehealth (INDEPENDENT_AMBULATORY_CARE_PROVIDER_SITE_OTHER): Payer: Self-pay

## 2018-05-15 NOTE — Telephone Encounter (Signed)
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  The only treatment for this for the left hip would be a hip replacement.  The labrum is degenerative with a torn due to the cartilage being gone or worn down in his left hip.  Secondly, the fluid around his right hip is normal postoperative fluid from having surgery and that will slowly dissolve with time.  Finally, from a hernia standpoint, we can certainly refer [facility] to [facility] for surgical evaluation of this.  He can see the surgeons over there including my brother.

## 2018-05-15 NOTE — Telephone Encounter (Signed)
Yes the MRI says that he has a small hernia.  I am unsure as to the clinical significance of this.  That is where it is worth getting an opinion from a general surgeon whether or not anything needs to be done about that.  As far as arthroscopic surgery goes, his arthritis is too severe in his left hip to perform arthroscopic surgery on for any labral tear.  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

## 2018-05-15 NOTE — Telephone Encounter (Signed)
Email from patient:  I saw Dr. Magnus IvanBlackman today (6/18) because he had to go over my MRI of my left hip inner thigh area. 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 It also says that I have fluid collection extending from the right hip prosthesis along the gluteus musculature AND small fat-containing left inguinal hernia.  In light of this MRI, I have FOUR questions. First, with the above decribed tear, etc., can I continue to exercise daily on my elliptical in going slowly and mainly backwards? Second, is the fluid collection extending from my right hip replacement a concern with my new hip? Third, do I have a left inguinal hernia that is causing pain in my left inner thigh, or is this pain coming from the left acetabular labral tear? Finally, what doctor would do the surgerical repair of my acetabular labral? Since the MRI seems to be saying this is why I am having pain in my inner thigh (groin) area, it seems that this should be done. (????)  Seth Wood

## 2018-05-15 NOTE — Telephone Encounter (Signed)
See below

## 2018-05-15 NOTE — Telephone Encounter (Signed)
I just watched a video and an explanation of an orthopedic doing a hip anthroscopic procedure for an acetabular labral tear. So I am really confused about this. Second, I am asking about a possible left side hernia based only on the MRI. That is, is the MRI saying that I have this

## 2018-05-15 NOTE — Telephone Encounter (Signed)
Emailed this to patient

## 2018-05-16 NOTE — Telephone Encounter (Signed)
Emailed to patient.

## 2018-05-17 ENCOUNTER — Telehealth (INDEPENDENT_AMBULATORY_CARE_PROVIDER_SITE_OTHER): Payer: Self-pay | Admitting: *Deleted

## 2018-05-17 NOTE — Telephone Encounter (Signed)
Pt states that he is having hip surgery by Dr. Magnus IvanBlackman and will not proceed with hip injection. Closed referral.

## 2018-05-24 ENCOUNTER — Other Ambulatory Visit (INDEPENDENT_AMBULATORY_CARE_PROVIDER_SITE_OTHER): Payer: Self-pay | Admitting: Physician Assistant

## 2018-05-31 ENCOUNTER — Encounter (HOSPITAL_COMMUNITY): Payer: Self-pay

## 2018-06-03 ENCOUNTER — Other Ambulatory Visit (INDEPENDENT_AMBULATORY_CARE_PROVIDER_SITE_OTHER): Payer: Self-pay | Admitting: Orthopaedic Surgery

## 2018-06-04 ENCOUNTER — Other Ambulatory Visit: Payer: Self-pay

## 2018-06-04 ENCOUNTER — Ambulatory Visit (HOSPITAL_COMMUNITY)
Admission: RE | Admit: 2018-06-04 | Discharge: 2018-06-04 | Disposition: A | Discharge status: Home / Self Care | Payer: BC Managed Care – PPO | Source: Ambulatory Visit | Attending: Anesthesiology | Admitting: Anesthesiology

## 2018-06-04 ENCOUNTER — Encounter (HOSPITAL_COMMUNITY): Payer: Self-pay

## 2018-06-04 ENCOUNTER — Encounter (HOSPITAL_COMMUNITY)
Admission: RE | Admit: 2018-06-04 | Discharge: 2018-06-04 | Disposition: A | Discharge status: Home / Self Care | Payer: BC Managed Care – PPO | Source: Ambulatory Visit | Attending: Orthopaedic Surgery | Admitting: Orthopaedic Surgery

## 2018-06-04 DIAGNOSIS — Z01818 Encounter for other preprocedural examination: Secondary | ICD-10-CM | POA: Diagnosis not present

## 2018-06-04 DIAGNOSIS — M1612 Unilateral primary osteoarthritis, left hip: Secondary | ICD-10-CM | POA: Insufficient documentation

## 2018-06-04 DIAGNOSIS — R918 Other nonspecific abnormal finding of lung field: Secondary | ICD-10-CM | POA: Insufficient documentation

## 2018-06-04 DIAGNOSIS — E890 Postprocedural hypothyroidism: Secondary | ICD-10-CM | POA: Insufficient documentation

## 2018-06-04 DIAGNOSIS — I700 Atherosclerosis of aorta: Secondary | ICD-10-CM | POA: Diagnosis not present

## 2018-06-04 HISTORY — DX: Furuncle, unspecified: L02.92

## 2018-06-04 HISTORY — DX: Benign prostatic hyperplasia with lower urinary tract symptoms: N40.1

## 2018-06-04 HISTORY — DX: Paresthesia of skin: R20.2

## 2018-06-04 HISTORY — DX: Personal history of colon polyps, unspecified: Z86.0100

## 2018-06-04 HISTORY — DX: Unilateral inguinal hernia, without obstruction or gangrene, not specified as recurrent: K40.90

## 2018-06-04 HISTORY — DX: Impingement syndrome of right shoulder: M75.41

## 2018-06-04 HISTORY — DX: Hyperlipidemia, unspecified: E78.5

## 2018-06-04 HISTORY — DX: Personal history of other diseases of the respiratory system: Z87.09

## 2018-06-04 HISTORY — DX: Radiculopathy, lumbar region: M54.16

## 2018-06-04 HISTORY — DX: Other specified diseases of anus and rectum: K62.89

## 2018-06-04 HISTORY — DX: Low back pain, unspecified: M54.50

## 2018-06-04 HISTORY — DX: Vitamin D deficiency, unspecified: E55.9

## 2018-06-04 HISTORY — DX: Personal history of colonic polyps: Z86.010

## 2018-06-04 HISTORY — DX: Low back pain: M54.5

## 2018-06-04 HISTORY — DX: Anesthesia of skin: R20.0

## 2018-06-04 HISTORY — DX: Bicipital tendinitis, unspecified shoulder: M75.20

## 2018-06-04 HISTORY — DX: Unspecified osteoarthritis, unspecified site: M19.90

## 2018-06-04 LAB — CBC
HCT: 43.8 % (ref 39.0–52.0)
Hemoglobin: 14.9 g/dL (ref 13.0–17.0)
MCH: 30.3 pg (ref 26.0–34.0)
MCHC: 34 g/dL (ref 30.0–36.0)
MCV: 89.2 fL (ref 78.0–100.0)
Platelets: 352 10*3/uL (ref 150–400)
RBC: 4.91 MIL/uL (ref 4.22–5.81)
RDW: 12.8 % (ref 11.5–15.5)
WBC: 5.9 10*3/uL (ref 4.0–10.5)

## 2018-06-04 LAB — SURGICAL PCR SCREEN
MRSA, PCR: POSITIVE — AB
Staphylococcus aureus: POSITIVE — AB

## 2018-06-04 NOTE — Patient Instructions (Signed)
Your procedure is scheduled on: Friday, June 07, 2018   Surgery Time:  3:15PM-4:45PM   Report to [facility] Main  Entrance    Report to admitting at 12:45 PM   Call this number if you have problems the morning of surgery [phone]   Do not eat food:After Midnight.   Do NOT smoke after Midnight    May have liquids until 9:00AM      CLEAR LIQUID DIET   Foods Allowed                                                                     Foods Excluded  Coffee and tea, regular and decaf                             liquids that you cannot  Plain Jell-O in any flavor                                             see through such as: Fruit ices (not with fruit pulp)                                     milk, soups, orange juice  Iced Popsicles                                    All solid food Carbonated beverages, regular and diet                                    Cranberry, grape and apple juices Sports drinks like Gatorade Lightly seasoned clear broth or consume(fat free) Sugar, honey syrup  Sample Menu Breakfast                                Lunch                                     Supper Cranberry juice                    Beef broth                            Chicken broth Jell-O                                     Grape juice                           Apple juice Coffee or tea  Jell-O                                      Popsicle                                                Coffee or tea                        Coffee or tea    Take these medicines the morning of surgery with A SIP OF WATER: None                                You may not have any metal on your body including jewelry, and body piercings             Do not wear lotions, powders, perfumes/cologne, or deodorant                          Men may shave face and neck.   Do not bring valuables to the hospital. [facility] IS NOT             RESPONSIBLE   FOR  VALUABLES.   Contacts, dentures or bridgework may not be worn into surgery.   Leave suitcase in the car. After surgery it may be brought to your room.   Special Instructions: Bring a copy of your healthcare power of attorney and living will documents         the day of surgery if you haven't scanned them in before.              Please read over the following fact sheets you were given:  [facility] - Preparing for Surgery Before surgery, you can play an important role.  Because skin is not sterile, your skin needs to be as free of germs as possible.  You can reduce the number of germs on your skin by washing with CHG (chlorahexidine gluconate) soap before surgery.  CHG is an antiseptic cleaner which kills germs and bonds with the skin to continue killing germs even after washing. Please DO NOT use if you have an allergy to CHG or antibacterial soaps.  If your skin becomes reddened/irritated stop using the CHG and inform your nurse when you arrive at Short Stay. Do not shave (including legs and underarms) for at least 48 hours prior to the first CHG shower.  You may shave your face/neck.  Please follow these instructions carefully:  1.  Shower with CHG Soap the night before surgery and the  morning of surgery.  2.  If you choose to wash your hair, wash your hair first as usual with your normal  shampoo.  3.  After you shampoo, rinse your hair and body thoroughly to remove the shampoo.                             4.  Use CHG as you would any other liquid soap.  You can apply chg directly to the skin and wash.  Gently with a scrungie or clean washcloth.  5.  Apply the CHG  Soap to your body ONLY FROM THE NECK DOWN.   Do   not use on face/ open                           Wound or open sores. Avoid contact with eyes, ears mouth and   genitals (private parts).                       Wash face,  Genitals (private parts) with your normal soap.             6.  Wash thoroughly, paying special attention to  the area where your    surgery  will be performed.  7.  Thoroughly rinse your body with warm water from the neck down.  8.  DO NOT shower/wash with your normal soap after using and rinsing off the CHG Soap.                9.  Pat yourself dry with a clean towel.            10.  Wear clean pajamas.            11.  Place clean sheets on your bed the night of your first shower and do not  sleep with pets. Day of Surgery : Do not apply any lotions/deodorants the morning of surgery.  Please wear clean clothes to the hospital/surgery center.  FAILURE TO FOLLOW THESE INSTRUCTIONS MAY RESULT IN THE CANCELLATION OF YOUR SURGERY  PATIENT SIGNATURE_________________________________  NURSE SIGNATURE__________________________________  ________________________________________________________________________   Rogelia MireIncentive Spirometer  An incentive spirometer is a tool that can help keep your lungs clear and active. This tool measures how well you are filling your lungs with each breath. Taking long deep breaths may help reverse or decrease the chance of developing breathing (pulmonary) problems (especially infection) following:  A long period of time when you are unable to move or be active. BEFORE THE PROCEDURE   If the spirometer includes an indicator to show your best effort, your nurse or respiratory therapist will set it to a desired goal.  If possible, sit up straight or lean slightly forward. Try not to slouch.  Hold the incentive spirometer in an upright position. INSTRUCTIONS FOR USE  1. Sit on the edge of your bed if possible, or sit up as far as you can in bed or on a chair. 2. Hold the incentive spirometer in an upright position. 3. Breathe out normally. 4. Place the mouthpiece in your mouth and seal your lips tightly around it. 5. Breathe in slowly and as deeply as possible, raising the piston or the ball toward the top of the column. 6. Hold your breath for 3-5 seconds or for as long  as possible. Allow the piston or ball to fall to the bottom of the column. 7. Remove the mouthpiece from your mouth and breathe out normally. 8. Rest for a few seconds and repeat Steps 1 through 7 at least 10 times every 1-2 hours when you are awake. Take your time and take a few normal breaths between deep breaths. 9. The spirometer may include an indicator to show your best effort. Use the indicator as a goal to work toward during each repetition. 10. After each set of 10 deep breaths, practice coughing to be sure your lungs are clear. If you have an incision (the cut made at the time of surgery), support your incision when coughing  by placing a pillow or rolled up towels firmly against it. Once you are able to get out of bed, walk around indoors and cough well. You may stop using the incentive spirometer when instructed by your caregiver.  RISKS AND COMPLICATIONS  Take your time so you do not get dizzy or light-headed.  If you are in pain, you may need to take or ask for pain medication before doing incentive spirometry. It is harder to take a deep breath if you are having pain. AFTER USE  Rest and breathe slowly and easily.  It can be helpful to keep track of a log of your progress. Your caregiver can provide you with a simple table to help with this. If you are using the spirometer at home, follow these instructions: SEEK MEDICAL CARE IF:   You are having difficultly using the spirometer.  You have trouble using the spirometer as often as instructed.  Your pain medication is not giving enough relief while using the spirometer.  You develop fever of 100.5 F (38.1 C) or higher. SEEK IMMEDIATE MEDICAL CARE IF:   You cough up bloody sputum that had not been present before.  You develop fever of 102 F (38.9 C) or greater.  You develop worsening pain at or near the incision site. MAKE SURE YOU:   Understand these instructions.  Will watch your condition.  Will get help right  away if you are not doing well or get worse. Document Released: 03/26/2007 Document Revised: 02/05/2012 Document Reviewed: 05/27/2007 [facility] Patient Information 2014 [place], [place].   ________________________________________________________________________

## 2018-06-04 NOTE — Pre-Procedure Instructions (Signed)
PCR and CXR 06/04/2018 results faxed to Dr. Magnus IvanBlackman via epic.  CXR results 06/04/2018 results faxed to C. Cowen PA via epic.

## 2018-06-05 ENCOUNTER — Telehealth (INDEPENDENT_AMBULATORY_CARE_PROVIDER_SITE_OTHER): Payer: Self-pay

## 2018-06-05 NOTE — Telephone Encounter (Signed)
Email from patient:   Seth Wood, Dr. Magnus IvanBlackman is replacing my left hip this Friday (7/12). Since I will not see him or talk with him until AFTER surgery, please share with him the two things I am going to share. First, I had my right hip replaced 11 weeks ago (4/23), and a physical therapist came to my house for 5 weeks to rehab this new hip. Not only do I have all the exercises that I did 11 to 6 weeks ago (and I still do some of them), I often said to myself (and sometimes to my wife) that "this is a waste of my time." I know it really was not, and I am thankful for the rehab and hence helpful information obtained for my first hip replacement. Still, I am NOT going to have a therapist come here this time. Instead, I am going to do these same exercises for this new hip without a therapist, as I have continued to do these on my own for the other hip, and I am going to do them daily. Therefore, unless Dr. Magnus IvanBlackman 100% disagrees with this (my plan), I am NOT going to have a physical therapist this time. Second, a couple of my friends have had a posterior hip replacement, and these have gone home the next day, thus staying just 1 night in the hospital. Since I am having another anterior hip replacement that's more efficient than the posterior, please express to Dr. Magnus IvanBlackman that this time I really want to go home the next day, thus this time staying just 1 night in the hospital. Thanks.  Seth Wood(Seth Wood) Seth BranchYost

## 2018-06-06 MED ORDER — DEXTROSE 5 % IV SOLN
3.0000 g | INTRAVENOUS | Status: AC
  Administered 2018-06-07: 3 g via INTRAVENOUS
  Filled 2018-06-06: qty 3

## 2018-06-06 MED ORDER — VANCOMYCIN HCL 10 G IV SOLR
1500.0000 mg | INTRAVENOUS | Status: AC
  Administered 2018-06-07: 1500 mg via INTRAVENOUS
  Filled 2018-06-06: qty 1500

## 2018-06-07 ENCOUNTER — Inpatient Hospital Stay (HOSPITAL_COMMUNITY)
Admission: RE | Admit: 2018-06-07 | Discharge: 2018-06-08 | DRG: 470 | Disposition: A | Discharge status: Home / Self Care | Payer: BC Managed Care – PPO | Attending: Orthopaedic Surgery | Admitting: Orthopaedic Surgery

## 2018-06-07 ENCOUNTER — Inpatient Hospital Stay (HOSPITAL_COMMUNITY): Payer: BC Managed Care – PPO

## 2018-06-07 ENCOUNTER — Inpatient Hospital Stay (HOSPITAL_COMMUNITY): Payer: BC Managed Care – PPO | Admitting: Certified Registered"

## 2018-06-07 ENCOUNTER — Encounter (HOSPITAL_COMMUNITY)
Admission: RE | Disposition: A | Discharge status: Home / Self Care | Payer: Self-pay | Source: Home / Self Care | Attending: Orthopaedic Surgery

## 2018-06-07 ENCOUNTER — Encounter (HOSPITAL_COMMUNITY): Payer: Self-pay | Admitting: Emergency Medicine

## 2018-06-07 ENCOUNTER — Other Ambulatory Visit: Payer: Self-pay

## 2018-06-07 DIAGNOSIS — M25752 Osteophyte, left hip: Secondary | ICD-10-CM | POA: Diagnosis present

## 2018-06-07 DIAGNOSIS — Z9181 History of falling: Secondary | ICD-10-CM

## 2018-06-07 DIAGNOSIS — Z6834 Body mass index (BMI) 34.0-34.9, adult: Secondary | ICD-10-CM

## 2018-06-07 DIAGNOSIS — E669 Obesity, unspecified: Secondary | ICD-10-CM | POA: Diagnosis present

## 2018-06-07 DIAGNOSIS — E785 Hyperlipidemia, unspecified: Secondary | ICD-10-CM | POA: Diagnosis present

## 2018-06-07 DIAGNOSIS — E890 Postprocedural hypothyroidism: Secondary | ICD-10-CM | POA: Diagnosis present

## 2018-06-07 DIAGNOSIS — Z86010 Personal history of colonic polyps: Secondary | ICD-10-CM

## 2018-06-07 DIAGNOSIS — M1612 Unilateral primary osteoarthritis, left hip: Secondary | ICD-10-CM | POA: Diagnosis present

## 2018-06-07 DIAGNOSIS — E559 Vitamin D deficiency, unspecified: Secondary | ICD-10-CM | POA: Diagnosis present

## 2018-06-07 DIAGNOSIS — M1610 Unilateral primary osteoarthritis, unspecified hip: Secondary | ICD-10-CM

## 2018-06-07 DIAGNOSIS — Z96641 Presence of right artificial hip joint: Secondary | ICD-10-CM | POA: Diagnosis present

## 2018-06-07 DIAGNOSIS — Z96642 Presence of left artificial hip joint: Secondary | ICD-10-CM

## 2018-06-07 HISTORY — PX: TOTAL HIP ARTHROPLASTY: SHX124

## 2018-06-07 SURGERY — ARTHROPLASTY, HIP, TOTAL, ANTERIOR APPROACH
Anesthesia: Monitor Anesthesia Care | Site: Hip | Laterality: Left

## 2018-06-07 MED ORDER — SODIUM CHLORIDE 0.9 % IR SOLN
Status: DC | PRN
  Administered 2018-06-07: 1000 mL

## 2018-06-07 MED ORDER — MIDAZOLAM HCL 5 MG/5ML IJ SOLN
INTRAMUSCULAR | Status: DC | PRN
  Administered 2018-06-07: 2 mg via INTRAVENOUS

## 2018-06-07 MED ORDER — ALUM & MAG HYDROXIDE-SIMETH 200-200-20 MG/5ML PO SUSP: 30.0000 mL | ORAL | Status: DC | PRN

## 2018-06-07 MED ORDER — MENTHOL 3 MG MT LOZG: 1.0000 | LOZENGE | OROMUCOSAL | Status: DC | PRN

## 2018-06-07 MED ORDER — ONDANSETRON HCL 4 MG/2ML IJ SOLN
INTRAMUSCULAR | Status: DC | PRN
  Administered 2018-06-07: 4 mg via INTRAVENOUS

## 2018-06-07 MED ORDER — SODIUM CHLORIDE 0.9 % IV SOLN
INTRAVENOUS | Status: DC
  Administered 2018-06-07: 18:00:00 via INTRAVENOUS

## 2018-06-07 MED ORDER — LIDOCAINE 2% (20 MG/ML) 5 ML SYRINGE
INTRAMUSCULAR | Status: DC | PRN
  Administered 2018-06-07: 60 mg via INTRAVENOUS

## 2018-06-07 MED ORDER — PANTOPRAZOLE SODIUM 40 MG PO TBEC
40.0000 mg | DELAYED_RELEASE_TABLET | Freq: Every day | ORAL | Status: DC
  Filled 2018-06-07: qty 1

## 2018-06-07 MED ORDER — TRANEXAMIC ACID 1000 MG/10ML IV SOLN
1000.0000 mg | INTRAVENOUS | Status: AC
  Administered 2018-06-07: 1000 mg via INTRAVENOUS
  Filled 2018-06-07: qty 1100
  Filled 2018-06-07: qty 10

## 2018-06-07 MED ORDER — VITAMIN D 1000 UNITS PO TABS
2000.0000 [IU] | ORAL_TABLET | Freq: Every day | ORAL | Status: DC
  Administered 2018-06-08: 2000 [IU] via ORAL
  Filled 2018-06-07: qty 2

## 2018-06-07 MED ORDER — PROPOFOL 500 MG/50ML IV EMUL
INTRAVENOUS | Status: DC | PRN
Start: 2018-06-07 — End: 2018-06-07
  Administered 2018-06-07: 75 ug/kg/min via INTRAVENOUS

## 2018-06-07 MED ORDER — METOCLOPRAMIDE HCL 5 MG PO TABS: 5.0000 mg | ORAL_TABLET | Freq: Three times a day (TID) | ORAL | Status: DC | PRN

## 2018-06-07 MED ORDER — VITAMIN C 500 MG PO TABS
1000.0000 mg | ORAL_TABLET | Freq: Every day | ORAL | Status: DC
  Administered 2018-06-08: 1000 mg via ORAL
  Filled 2018-06-07: qty 2

## 2018-06-07 MED ORDER — POLYETHYLENE GLYCOL 3350 17 G PO PACK: 17.0000 g | PACK | Freq: Every day | ORAL | Status: DC | PRN

## 2018-06-07 MED ORDER — BIOTIN 5000 MCG PO TABS: 5000.0000 ug | ORAL_TABLET | Freq: Every day | ORAL | Status: DC

## 2018-06-07 MED ORDER — CHLORHEXIDINE GLUCONATE CLOTH 2 % EX PADS
6.0000 | MEDICATED_PAD | Freq: Every day | CUTANEOUS | Status: DC
  Administered 2018-06-08: 6 via TOPICAL

## 2018-06-07 MED ORDER — DOCUSATE SODIUM 100 MG PO CAPS
100.0000 mg | ORAL_CAPSULE | Freq: Two times a day (BID) | ORAL | Status: DC
  Administered 2018-06-07 – 2018-06-08 (×2): 100 mg via ORAL
  Filled 2018-06-07 (×2): qty 1

## 2018-06-07 MED ORDER — FENTANYL CITRATE (PF) 100 MCG/2ML IJ SOLN
INTRAMUSCULAR | Status: DC | PRN
  Administered 2018-06-07 (×2): 50 ug via INTRAVENOUS

## 2018-06-07 MED ORDER — METOCLOPRAMIDE HCL 5 MG/ML IJ SOLN: 5.0000 mg | Freq: Three times a day (TID) | INTRAMUSCULAR | Status: DC | PRN

## 2018-06-07 MED ORDER — PROPOFOL 10 MG/ML IV BOLUS
INTRAVENOUS | Status: AC
  Filled 2018-06-07: qty 20

## 2018-06-07 MED ORDER — OXYCODONE HCL 5 MG/5ML PO SOLN
5.0000 mg | Freq: Once | ORAL | Status: DC | PRN
  Filled 2018-06-07: qty 5

## 2018-06-07 MED ORDER — METHOCARBAMOL 500 MG PO TABS
500.0000 mg | ORAL_TABLET | Freq: Four times a day (QID) | ORAL | Status: DC | PRN
  Administered 2018-06-07 – 2018-06-08 (×3): 500 mg via ORAL
  Filled 2018-06-07 (×3): qty 1

## 2018-06-07 MED ORDER — OXYCODONE HCL 5 MG PO TABS: 5.0000 mg | ORAL_TABLET | ORAL | Status: DC | PRN

## 2018-06-07 MED ORDER — ONDANSETRON HCL 4 MG/2ML IJ SOLN
INTRAMUSCULAR | Status: AC
  Filled 2018-06-07: qty 2

## 2018-06-07 MED ORDER — HYDROMORPHONE HCL 1 MG/ML IJ SOLN
1.0000 mg | INTRAMUSCULAR | Status: DC | PRN
Start: 2018-06-07 — End: 2018-06-08

## 2018-06-07 MED ORDER — CHLORHEXIDINE GLUCONATE 4 % EX LIQD: 60.0000 mL | Freq: Once | CUTANEOUS | Status: DC

## 2018-06-07 MED ORDER — LACTATED RINGERS IV SOLN
INTRAVENOUS | Status: DC
  Administered 2018-06-07 (×3): via INTRAVENOUS

## 2018-06-07 MED ORDER — ACETAMINOPHEN 325 MG PO TABS
325.0000 mg | ORAL_TABLET | Freq: Four times a day (QID) | ORAL | Status: DC | PRN
  Administered 2018-06-07 – 2018-06-08 (×3): 650 mg via ORAL
  Filled 2018-06-07 (×3): qty 2

## 2018-06-07 MED ORDER — DEXAMETHASONE SODIUM PHOSPHATE 10 MG/ML IJ SOLN
INTRAMUSCULAR | Status: AC
  Filled 2018-06-07: qty 1

## 2018-06-07 MED ORDER — ASPIRIN 81 MG PO CHEW
81.0000 mg | CHEWABLE_TABLET | Freq: Two times a day (BID) | ORAL | Status: DC
  Administered 2018-06-07 – 2018-06-08 (×2): 81 mg via ORAL
  Filled 2018-06-07 (×2): qty 1

## 2018-06-07 MED ORDER — OXYCODONE HCL 5 MG PO TABS: 10.0000 mg | ORAL_TABLET | ORAL | Status: DC | PRN

## 2018-06-07 MED ORDER — FENTANYL CITRATE (PF) 100 MCG/2ML IJ SOLN
INTRAMUSCULAR | Status: AC
  Filled 2018-06-07: qty 2

## 2018-06-07 MED ORDER — METHOCARBAMOL 1000 MG/10ML IJ SOLN
500.0000 mg | Freq: Four times a day (QID) | INTRAMUSCULAR | Status: DC | PRN
  Administered 2018-06-07: 500 mg via INTRAVENOUS
  Filled 2018-06-07: qty 550

## 2018-06-07 MED ORDER — PROPOFOL 10 MG/ML IV BOLUS
INTRAVENOUS | Status: AC
  Filled 2018-06-07: qty 60

## 2018-06-07 MED ORDER — MIDAZOLAM HCL 2 MG/2ML IJ SOLN
INTRAMUSCULAR | Status: AC
  Filled 2018-06-07: qty 2

## 2018-06-07 MED ORDER — OXYCODONE HCL 5 MG PO TABS: 5.0000 mg | ORAL_TABLET | Freq: Once | ORAL | Status: DC | PRN

## 2018-06-07 MED ORDER — BUPIVACAINE IN DEXTROSE 0.75-8.25 % IT SOLN
INTRATHECAL | Status: DC | PRN
  Administered 2018-06-07: 1.6 mL via INTRATHECAL

## 2018-06-07 MED ORDER — PHENOL 1.4 % MT LIQD: 1.0000 | OROMUCOSAL | Status: DC | PRN

## 2018-06-07 MED ORDER — DEXAMETHASONE SODIUM PHOSPHATE 10 MG/ML IJ SOLN
INTRAMUSCULAR | Status: DC | PRN
Start: 2018-06-07 — End: 2018-06-07
  Administered 2018-06-07: 10 mg via INTRAVENOUS

## 2018-06-07 MED ORDER — ONDANSETRON HCL 4 MG PO TABS: 4.0000 mg | ORAL_TABLET | Freq: Four times a day (QID) | ORAL | Status: DC | PRN

## 2018-06-07 MED ORDER — MUPIROCIN 2 % EX OINT
1.0000 "application " | TOPICAL_OINTMENT | Freq: Two times a day (BID) | CUTANEOUS | Status: DC
  Administered 2018-06-07 – 2018-06-08 (×2): 1 via NASAL
  Filled 2018-06-07: qty 44

## 2018-06-07 MED ORDER — STERILE WATER FOR IRRIGATION IR SOLN
Status: DC | PRN
  Administered 2018-06-07: 2000 mL

## 2018-06-07 MED ORDER — FENTANYL CITRATE (PF) 100 MCG/2ML IJ SOLN: 25.0000 ug | INTRAMUSCULAR | Status: DC | PRN

## 2018-06-07 MED ORDER — VANCOMYCIN HCL IN DEXTROSE 1-5 GM/200ML-% IV SOLN
1000.0000 mg | Freq: Two times a day (BID) | INTRAVENOUS | Status: AC
  Administered 2018-06-07: 1000 mg via INTRAVENOUS
  Filled 2018-06-07: qty 200

## 2018-06-07 MED ORDER — LIDOCAINE 2% (20 MG/ML) 5 ML SYRINGE
INTRAMUSCULAR | Status: AC
  Filled 2018-06-07: qty 5

## 2018-06-07 MED ORDER — ONDANSETRON HCL 4 MG/2ML IJ SOLN: 4.0000 mg | Freq: Four times a day (QID) | INTRAMUSCULAR | Status: DC | PRN

## 2018-06-07 SURGICAL SUPPLY — 36 items
BAG ZIPLOCK 12X15 (MISCELLANEOUS) IMPLANT
BENZOIN TINCTURE PRP APPL 2/3 (GAUZE/BANDAGES/DRESSINGS) IMPLANT
BLADE SAW SGTL 18X1.27X75 (BLADE) ×2 IMPLANT
BLADE SAW SGTL 18X1.27X75MM (BLADE) ×1
CAPT HIP TOTAL 2 ×3 IMPLANT
CLOSURE WOUND 1/2 X4 (GAUZE/BANDAGES/DRESSINGS)
COVER PERINEAL POST (MISCELLANEOUS) ×3 IMPLANT
COVER SURGICAL LIGHT HANDLE (MISCELLANEOUS) ×3 IMPLANT
DRAPE STERI IOBAN 125X83 (DRAPES) ×3 IMPLANT
DRAPE U-SHAPE 47X51 STRL (DRAPES) ×6 IMPLANT
DRSG AQUACEL AG ADV 3.5X10 (GAUZE/BANDAGES/DRESSINGS) ×3 IMPLANT
DURAPREP 26ML APPLICATOR (WOUND CARE) ×3 IMPLANT
ELECT REM PT RETURN 15FT ADLT (MISCELLANEOUS) ×3 IMPLANT
GAUZE XEROFORM 1X8 LF (GAUZE/BANDAGES/DRESSINGS) ×3 IMPLANT
GLOVE BIO SURGEON STRL SZ7.5 (GLOVE) ×3 IMPLANT
GLOVE BIOGEL PI IND STRL 7.0 (GLOVE) ×4 IMPLANT
GLOVE BIOGEL PI IND STRL 8 (GLOVE) ×3 IMPLANT
GLOVE BIOGEL PI INDICATOR 7.0 (GLOVE) ×8
GLOVE BIOGEL PI INDICATOR 8 (GLOVE) ×6
GLOVE ECLIPSE 8.0 STRL XLNG CF (GLOVE) ×6 IMPLANT
GOWN SPEC L3 XXLG W/TWL (GOWN DISPOSABLE) ×6 IMPLANT
GOWN STRL REUS W/TWL XL LVL3 (GOWN DISPOSABLE) ×9 IMPLANT
HANDPIECE INTERPULSE COAX TIP (DISPOSABLE) ×2
HOLDER FOLEY CATH W/STRAP (MISCELLANEOUS) ×3 IMPLANT
PACK ANTERIOR HIP CUSTOM (KITS) ×3 IMPLANT
SET HNDPC FAN SPRY TIP SCT (DISPOSABLE) ×1 IMPLANT
STAPLER VISISTAT 35W (STAPLE) ×3 IMPLANT
STRIP CLOSURE SKIN 1/2X4 (GAUZE/BANDAGES/DRESSINGS) IMPLANT
SUT ETHIBOND NAB CT1 #1 30IN (SUTURE) ×3 IMPLANT
SUT MNCRL AB 4-0 PS2 18 (SUTURE) IMPLANT
SUT VIC AB 0 CT1 36 (SUTURE) ×3 IMPLANT
SUT VIC AB 1 CT1 36 (SUTURE) ×3 IMPLANT
SUT VIC AB 2-0 CT1 27 (SUTURE) ×4
SUT VIC AB 2-0 CT1 TAPERPNT 27 (SUTURE) ×2 IMPLANT
TRAY FOLEY MTR SLVR 16FR STAT (SET/KITS/TRAYS/PACK) ×3 IMPLANT
YANKAUER SUCT BULB TIP 10FT TU (MISCELLANEOUS) ×3 IMPLANT

## 2018-06-07 NOTE — Anesthesia Postprocedure Evaluation (Signed)
Anesthesia Post Note  Patient: Claris GowerBenny R Keiper  Procedure(s) Performed: LEFT TOTAL HIP ARTHROPLASTY ANTERIOR APPROACH (Left Hip)     Patient location during evaluation: PACU Anesthesia Type: Spinal Level of consciousness: awake Pain management: pain level controlled Vital Signs Assessment: post-procedure vital signs reviewed and stable Respiratory status: spontaneous breathing Cardiovascular status: stable Postop Assessment: no headache, no backache, spinal receding, patient able to bend at knees and no apparent nausea or vomiting Anesthetic complications: no    Last Vitals:  Vitals:   06/07/18 1728 06/07/18 1739  BP: (!) 144/89 (!) 138/93  Pulse: (!) 58   Resp:  16  Temp:  36.9 C  SpO2: 100% 100%    Last Pain:  Vitals:   06/07/18 1630  TempSrc:   PainSc: 0-No pain   Pain Goal: Patients Stated Pain Goal: 4 (06/07/18 1315)               Nishanth Mccaughan JR,JOHN Susann GivensFRANKLIN

## 2018-06-07 NOTE — Op Note (Signed)
NAMECEBASTIAN, NEIS MEDICAL RECORD ZO:[id number] ACCOUNT [id number] DATE OF BIRTH:04-Nov-1956 FACILITY: [facility] LOCATION: [facility] PHYSICIAN:Kevontae Burgoon Aretha Parrot, MD  OPERATIVE REPORT  DATE OF PROCEDURE:  06/07/2018  PREOPERATIVE DIAGNOSIS:  Primary osteoarthritis and degenerative joint disease, left hip.  POSTOPERATIVE DIAGNOSIS:  Primary osteoarthritis and degenerative joint disease, left hip.  PROCEDURE PERFORMED:  Left total hip arthroplasty through direct anterior approach.  IMPLANTS:  DePuy Sector Gription acetabular component size 56, size 36+0 neutral polyethylene liner, size 15 Corail femoral component with varus offset, size 36+5 ceramic hip ball.  SURGEON:  Vanita Panda. Magnus Ivan, MD  ASSISTANT:  Richardean Canal, PA-C.  ANESTHESIA:  Spinal.  ANTIBIOTICS:  Vancomycin IV.  ESTIMATED BLOOD LOSS:  400 mL.  COMPLICATIONS:  None.  INDICATIONS:  The patient is a 62 year old mildly obese individual, well known to me.  He has debilitating arthritis of both his hips.  We actually performed a right total hip arthroplasty on him 3 months ago.  He wishes to have the left one addressed  now with a replacement given his success in his right side.  His left side does show end-stage arthritis.  His pain is daily and it is detrimentally affecting his activities of daily living, his quality of life, as well as mobility.  He understands fully  having had this before.  There is risk of acute blood loss anemia, nerve or vessel injury, fracture, infection, dislocation, DVT as well as skin issues and wound issues given his obesity.  He knows these are heightened as well.  He understands our goals  are to decrease pain, improve mobility and overall improve quality of life.  DESCRIPTION OF PROCEDURE:  After informed consent was obtained, appropriate left hip was marked.  He was brought to the operating room where spinal anesthesia was obtained while he was on a stretcher.  A Foley catheter was  placed.  Both feet had traction  boots applied to them.  Next, he was placed supine on the Hana fracture table with perineal post in place.  Both legs in line skeletal traction device and no traction applied.  His left operative hip was prepped and draped with DuraPrep and sterile  drapes.  A time-out was called.  He was identified, correct patient, correct left hip.  We then made an incision just inferior and posterior to the anterior superior iliac spine and carried this obliquely down the leg.  We dissected down tensor fascia  lata muscle.  Tensor fascia was then divided longitudinally to proceed with a _____to broach the hip.  We identified and cauterized the circumflex vessels.  I then identified the hip capsule.  After identifying the hip capsule finding large joint  effusion and significant periarticular osteophytes around the femoral head and neck.  We placed Cobra retractors around the medial and lateral femoral neck and made our femoral neck cut with an oscillating saw proximal to the lesser trochanter and  completed this with an osteotome.  We placed a corkscrew down the femoral head and removed the femoral head in its entirety and found it to be devoid of cartilage.  We passed this off the table.  We then placed a bent Hohmann over the medial acetabular  rim and removed remnants of the acetabular labrum and other debris and then began reaming from a size 44 reamer in stepwise increments, going all the way up to a size 55 with all reamers under direct visualization, the last reamer under direct  fluoroscopy, so I could obtain my depth of  reaming, my inclination and anteversion.  We then placed the real DePuy Sector Gription acetabular component size 56 and a 36+0 neutral polyethylene liner for that size acetabular component.  Attention was then  turned to the femur.  With the leg externally rotated to 120 degrees, extended and adducted, I was able to use a Mueller retractor medially and a Hohmann  retractor behind the greater trochanter.  We released the lateral joint capsule and used a  box-cutting osteotome to enter femoral canal and a rongeur to lateralize and then began broaching from a size 8 broach, going all the way up to a size 15.  With the 15 in place, we tried a varus offset femoral neck and a 36+5 hip ball which correlate  with his other side as well.  We reduced this in the acetabulum.  We were pleased with his stability, leg length, offset and the pre films and intraoperative films as it relates to his leg length.  Of note, his x-rays intraoperative and preoperative  showed it looks like he has a leg length discrepancy with his left shorter than right, but that is not true according to his clinical exam.  We then dislocated the hip and removed the trial components.  I placed the real size 15 Corail femoral component  with varus offset and the real 36+5 ceramic hip ball and again reduced this in the acetabulum and I was pleased with stability.  We then irrigated the soft tissue with normal saline solution and closed the joint capsule with interrupted #1 Ethibond  suture.  We closed the tensor fascia with a running #1 Vicryl suture followed by 0 Vicryl in the deep tissue, 2-0 Vicryl subcutaneous tissue, interrupted staples on the skin.  Xeroform and Aquacel dressing was applied.    He was awakened, extubated, and taken to recovery room in stable condition.  All final counts were correct.  There were no complications noted.  Of note, Rexene EdisonGil Clark, PA-C assisted the entire case.  His assistance was crucial for assisting in all aspects of this case.  AN/NUANCE  D:06/07/2018 T:06/07/2018 JOB:[id number]

## 2018-06-07 NOTE — Anesthesia Procedure Notes (Signed)
Spinal  Patient location during procedure: OR Start time: 06/07/2018 2:43 PM End time: 06/07/2018 2:46 PM Staffing Anesthesiologist: Leilani AbleHatchett, Wilson Sample, MD Performed: anesthesiologist  Preanesthetic Checklist Completed: patient identified, site marked, surgical consent, pre-op evaluation, timeout performed, IV checked, risks and benefits discussed and monitors and equipment checked Spinal Block Patient position: sitting Prep: site prepped and draped and DuraPrep Patient monitoring: continuous pulse ox and blood pressure Approach: midline Location: L3-4 Injection technique: single-shot Needle Needle type: Pencan  Needle gauge: 24 G Needle length: 10 cm Needle insertion depth: 5 cm Assessment Sensory level: T8

## 2018-06-07 NOTE — Brief Op Note (Signed)
06/07/2018  4:14 PM  PATIENT:  Seth Wood  62 y.o. male  PRE-OPERATIVE DIAGNOSIS:  osteoarthritis left hip  POST-OPERATIVE DIAGNOSIS:  osteoarthritis left hip  PROCEDURE:  Procedure(s): LEFT TOTAL HIP ARTHROPLASTY ANTERIOR APPROACH (Left)  SURGEON:  Surgeon(s) and Role:    Kathryne Hitch* Averill Winters Y, MD - Primary  PHYSICIAN ASSISTANT: Rexene EdisonGil Clark, PA-C assisted  ANESTHESIA:   spinal  EBL:  400 mL   COUNTS:  YES  DICTATION: .Other Dictation: Dictation Number [id number]  PLAN OF CARE: Admit to inpatient   PATIENT DISPOSITION:  PACU - hemodynamically stable.   Delay start of Pharmacological VTE agent (>24hrs) due to surgical blood loss or risk of bleeding: no

## 2018-06-07 NOTE — H&P (Signed)
TOTAL HIP ADMISSION H&P  Patient is admitted for left total hip arthroplasty.  Subjective:  Chief Complaint: left hip pain  HPI: Claris Gower, 62 y.o. male, has a history of pain and functional disability in the left hip(s) due to arthritis and patient has failed non-surgical conservative treatments for greater than 12 weeks to include NSAID's and/or analgesics, corticosteriod injections, flexibility and strengthening excercises, supervised PT with diminished ADL's post treatment, weight reduction as appropriate and activity modification.  Onset of symptoms was gradual starting 2 years ago with gradually worsening course since that time.The patient noted no past surgery on the left hip(s).  Patient currently rates pain in the left hip at 10 out of 10 with activity. Patient has night pain, worsening of pain with activity and weight bearing, trendelenberg gait, pain that interfers with activities of daily living and pain with passive range of motion. Patient has evidence of subchondral sclerosis, periarticular osteophytes and joint space narrowing by imaging studies. This condition presents safety issues increasing the risk of falls.  There is no current active infection.  Patient Active Problem List   Diagnosis Date Noted  . Unilateral primary osteoarthritis, left hip 05/14/2018  . Severe left groin pain 04/25/2018  . Status post total replacement of right hip 03/19/2018  . Unilateral primary osteoarthritis, right hip 02/21/2018   Past Medical History:  Diagnosis Date  . Benign nodular prostatic hyperplasia with lower urinary tract symptoms   . Bicipital tendinitis    Right shoulder  . Boil    Under right arm  . Complication of anesthesia    pt had too much anesthesia with hernia surgery and had nausea/vomiting  . Goiter    hx  . History of bronchitis    no problem in years  . History of colon polyps   . Hyperlipidemia   . Impingement syndrome of right shoulder   . Inguinal hernia    Right  . Left lumbar radiculopathy   . Low back pain   . Numbness and tingling of right arm   . OA (osteoarthritis)    "right hip" (03/19/2018)  . PONV (postoperative nausea and vomiting)   . Rectal pain   . Right inguinal hernia   . Vitamin D deficiency     Past Surgical History:  Procedure Laterality Date  . COLONOSCOPY  09/23/2018  . INGUINAL HERNIA REPAIR Right   . REPAIR KNEE LIGAMENT Left 1977   "repair of torn ligament; put pin in it"  . THYROIDECTOMY, PARTIAL Right 2003  . TOTAL HIP ARTHROPLASTY Right 03/19/2018   Procedure: RIGHT TOTAL HIP ARTHROPLASTY ANTERIOR APPROACH;  Surgeon: Kathryne Hitch, MD;  Location: [facility] OR;  Service: Orthopedics;  Laterality: Right;    Current Facility-Administered Medications  Medication Dose Route Frequency Provider Last Rate Last Dose  . ceFAZolin (ANCEF) 3 g in dextrose 5 % 50 mL IVPB  3 g Intravenous On Call to OR Kathryne Hitch, MD      . chlorhexidine (HIBICLENS) 4 % liquid 4 application  60 mL Topical Once Kirtland Bouchard, PA-C      . lactated ringers infusion   Intravenous Continuous Val Eagle, MD 50 mL/hr at 06/07/18 1318    . tranexamic acid (CYKLOKAPRON) 1,000 mg in sodium chloride 0.9 % 100 mL IVPB  1,000 mg Intravenous To OR Kathryne Hitch, MD      . vancomycin (VANCOCIN) 1,500 mg in sodium chloride 0.9 % 500 mL IVPB  1,500 mg Intravenous 60 min Pre-Op [place],  Loleta RoseAndrea M, RPH       No Known Allergies  Social History   Tobacco Use  . Smoking status: Never Smoker  . Smokeless tobacco: Never Used  Substance Use Topics  . Alcohol use: Not Currently    Frequency: Never    History reviewed. No pertinent family history.   Review of Systems  Musculoskeletal: Positive for joint pain.  All other systems reviewed and are negative.   Objective:  Physical Exam  Constitutional: He is oriented to person, place, and time. He appears well-developed and well-nourished.  HENT:  Head: Normocephalic  and atraumatic.  Eyes: Pupils are equal, round, and reactive to light. EOM are normal.  Neck: Normal range of motion. Neck supple.  Cardiovascular: Normal rate and regular rhythm.  Respiratory: Effort normal and breath sounds normal.  GI: Soft. Bowel sounds are normal.  Musculoskeletal:       Left hip: He exhibits decreased range of motion, decreased strength, tenderness and bony tenderness.  Neurological: He is alert and oriented to person, place, and time.  Skin: Skin is warm and dry.  Psychiatric: He has a normal mood and affect.    Vital signs in last 24 hours: Temp:  [98.4 F (36.9 C)] 98.4 F (36.9 C) (07/12 1258) Pulse Rate:  [93] 93 (07/12 1258) Resp:  [20] 20 (07/12 1258) BP: (168)/(76) 168/76 (07/12 1258) SpO2:  [96 %] 96 % (07/12 1258) Weight:  [241 lb (109.3 kg)] 241 lb (109.3 kg) (07/12 1315)  Labs:   Estimated body mass index is 34.83 kg/m as calculated from the following:   Height as of this encounter: 5' 9.75" (1.772 m).   Weight as of this encounter: 241 lb (109.3 kg).   Imaging Review Plain radiographs and MRI demonstrate severe degenerative joint disease of the left hip(s). The bone quality appears to be good for age and reported activity level.    Preoperative templating of the joint replacement has been completed, documented, and submitted to the Operating Room personnel in order to optimize intra-operative equipment management.     Assessment/Plan:  End stage arthritis, left hip(s)  The patient history, physical examination, clinical judgement of the provider and imaging studies are consistent with end stage degenerative joint disease of the left hip(s) and total hip arthroplasty is deemed medically necessary. The treatment options including medical management, injection therapy, arthroscopy and arthroplasty were discussed at length. The risks and benefits of total hip arthroplasty were presented and reviewed. The risks due to aseptic loosening,  infection, stiffness, dislocation/subluxation,  thromboembolic complications and other imponderables were discussed.  The patient acknowledged the explanation, agreed to proceed with the plan and consent was signed. Patient is being admitted for inpatient treatment for surgery, pain control, PT, OT, prophylactic antibiotics, VTE prophylaxis, progressive ambulation and ADL's and discharge planning.The patient is planning to be discharged home with home health services

## 2018-06-07 NOTE — Anesthesia Preprocedure Evaluation (Signed)
Anesthesia Evaluation  Patient identified by MRN, date of birth, ID band Patient awake    Reviewed: Allergy & Precautions, NPO status , Patient's Chart, lab work & pertinent test results  History of Anesthesia Complications (+) PONV and history of anesthetic complications  Airway Mallampati: I  TM Distance: >3 FB Neck ROM: Full    Dental  (+) Teeth Intact   Pulmonary neg pulmonary ROS,    breath sounds clear to auscultation       Cardiovascular negative cardio ROS Normal cardiovascular exam Rhythm:Regular Rate:Normal     Neuro/Psych negative neurological ROS  negative psych ROS   GI/Hepatic negative GI ROS, Neg liver ROS,   Endo/Other  negative endocrine ROS  Renal/GU negative Renal ROS  negative genitourinary   Musculoskeletal  (+) Arthritis ,   Abdominal (+) + obese,   Peds  Hematology   Anesthesia Other Findings   Reproductive/Obstetrics                             Anesthesia Physical Anesthesia Plan  ASA: II  Anesthesia Plan: MAC and Spinal   Post-op Pain Management:    Induction:   PONV Risk Score and Plan: 2 and Treatment may vary due to age or medical condition  Airway Management Planned: Nasal Cannula  Additional Equipment: None  Intra-op Plan:   Post-operative Plan:   Informed Consent: I have reviewed the patients History and Physical, chart, labs and discussed the procedure including the risks, benefits and alternatives for the proposed anesthesia with the patient or authorized representative who has indicated his/her understanding and acceptance.   Dental advisory given  Plan Discussed with: CRNA and Surgeon  Anesthesia Plan Comments: (Patient request Dr Jillyn Hidden for spinal placement)        Anesthesia Quick Evaluation

## 2018-06-07 NOTE — Anesthesia Procedure Notes (Signed)
Date/Time: 06/07/2018 2:57 PM Performed by: Jhonnie GarnerMarshall, Decorey Wahlert M, CRNA Oxygen Delivery Method: Simple face mask

## 2018-06-07 NOTE — Transfer of Care (Signed)
Immediate Anesthesia Transfer of Care Note  Patient: Seth GowerBenny R Wood  Procedure(s) Performed: LEFT TOTAL HIP ARTHROPLASTY ANTERIOR APPROACH (Left Hip)  Patient Location: PACU  Anesthesia Type:Spinal  Level of Consciousness: sedated  Airway & Oxygen Therapy: Patient Spontanous Breathing and Patient connected to face mask oxygen  Post-op Assessment: Report given to RN and Post -op Vital signs reviewed and stable  Post vital signs: Reviewed and stable  Last Vitals:  Vitals Value Taken Time  BP    Temp    Pulse 71 06/07/2018  4:29 PM  Resp 12 06/07/2018  4:29 PM  SpO2 94 % 06/07/2018  4:29 PM  Vitals shown include unvalidated device data.  Last Pain:  Vitals:   06/07/18 1315  TempSrc:   PainSc: 0-No pain      Patients Stated Pain Goal: 4 (06/07/18 1315)  Complications: No apparent anesthesia complications

## 2018-06-07 NOTE — Progress Notes (Signed)
PHARMACIST - PHYSICIAN ORDER COMMUNICATION  CONCERNING: P&T Medication Policy on Herbal Medications  DESCRIPTION:  This patient's order for:  Biotin has been noted.  This product(s) is classified as an "herbal" or natural product. Due to a lack of definitive safety studies or FDA approval, nonstandard manufacturing practices, plus the potential risk of unknown drug-drug interactions while on inpatient medications, the Pharmacy and Therapeutics Committee does not permit the use of "herbal" or natural products of this type within [facility].   ACTION TAKEN: The pharmacy department is unable to verify this order at this time and your patient has been informed of this safety policy. Please reevaluate patient's clinical condition at discharge and address if the herbal or natural product(s) should be resumed at that time.  Junita PushMichelle Cyris Maalouf, PharmD, BCPS 06/07/2018@5 :53 PM

## 2018-06-08 LAB — CBC
HEMATOCRIT: 39.9 % (ref 39.0–52.0)
Hemoglobin: 13.4 g/dL (ref 13.0–17.0)
MCH: 30.2 pg (ref 26.0–34.0)
MCHC: 33.6 g/dL (ref 30.0–36.0)
MCV: 90.1 fL (ref 78.0–100.0)
PLATELETS: 314 10*3/uL (ref 150–400)
RBC: 4.43 MIL/uL (ref 4.22–5.81)
RDW: 12.6 % (ref 11.5–15.5)
WBC: 13 10*3/uL — ABNORMAL HIGH (ref 4.0–10.5)

## 2018-06-08 MED ORDER — ASPIRIN 81 MG PO CHEW: 81.0000 mg | CHEWABLE_TABLET | Freq: Two times a day (BID) | ORAL | 0 refills | Status: AC

## 2018-06-08 MED ORDER — METHOCARBAMOL 500 MG PO TABS: 500.0000 mg | ORAL_TABLET | Freq: Four times a day (QID) | ORAL | 0 refills | Status: AC | PRN

## 2018-06-08 NOTE — Progress Notes (Signed)
Provided discharge instructions to patient and spouse.  Both verbalized understanding.  All questions/concerns addressed.  Patient discharging to home with all belongings and equipment.

## 2018-06-08 NOTE — Discharge Summary (Signed)
Patient ID: Seth Wood MRN: [id number] DOB/AGE: Nov 16, 1956 62 y.o.  Admit date: 06/07/2018 Discharge date: 06/08/2018  Admission Diagnoses:  Principal Problem:   Unilateral primary osteoarthritis, left hip Active Problems:   Status post total replacement of left hip   Discharge Diagnoses:  Same  Past Medical History:  Diagnosis Date  . Benign nodular prostatic hyperplasia with lower urinary tract symptoms   . Bicipital tendinitis    Right shoulder  . Boil    Under right arm  . Complication of anesthesia    pt had too much anesthesia with hernia surgery and had nausea/vomiting  . Goiter    hx  . History of bronchitis    no problem in years  . History of colon polyps   . Hyperlipidemia   . Impingement syndrome of right shoulder   . Inguinal hernia    Right  . Left lumbar radiculopathy   . Low back pain   . Numbness and tingling of right arm   . OA (osteoarthritis)    "right hip" (03/19/2018)  . PONV (postoperative nausea and vomiting)   . Rectal pain   . Right inguinal hernia   . Vitamin D deficiency     Surgeries: Procedure(s): LEFT TOTAL HIP ARTHROPLASTY ANTERIOR APPROACH on 06/07/2018   Consultants:   Discharged Condition: Improved  Hospital Course: Seth GowerBenny R Plumb is an 62 y.o. male who was admitted 06/07/2018 for operative treatment ofUnilateral primary osteoarthritis, left hip. Patient has severe unremitting pain that affects sleep, daily activities, and work/hobbies. After pre-op clearance the patient was taken to the operating room on 06/07/2018 and underwent  Procedure(s): LEFT TOTAL HIP ARTHROPLASTY ANTERIOR APPROACH.    Patient was given perioperative antibiotics:  Anti-infectives (From admission, onward)   Start     Dose/Rate Route Frequency Ordered Stop   06/07/18 2200  vancomycin (VANCOCIN) IVPB 1000 mg/200 mL premix     1,000 mg 200 mL/hr over 60 Minutes Intravenous Every 12 hours 06/07/18 1748 06/07/18 2341   06/07/18 0600  vancomycin (VANCOCIN)  1,500 mg in sodium chloride 0.9 % 500 mL IVPB     1,500 mg 250 mL/hr over 120 Minutes Intravenous 60 min pre-op 06/06/18 1034 06/07/18 1615   06/07/18 0600  ceFAZolin (ANCEF) 3 g in dextrose 5 % 50 mL IVPB     3 g 100 mL/hr over 30 Minutes Intravenous On call to O.R. 06/06/18 1034 06/07/18 1505       Patient was given sequential compression devices, early ambulation, and chemoprophylaxis to prevent DVT.  Patient benefited maximally from hospital stay and there were no complications.    Recent vital signs:  Patient Vitals for the past 24 hrs:  BP Temp Temp src Pulse Resp SpO2 Height Weight  06/08/18 0551 (!) 149/76 98.1 F (36.7 C) Oral 91 16 97 % - -  06/08/18 0129 (!) 146/79 98.4 F (36.9 C) Oral 66 16 97 % - -  06/07/18 2159 135/73 98.7 F (37.1 C) Oral 67 16 95 % - -  06/07/18 2029 (!) 143/72 98.7 F (37.1 C) Oral 70 16 96 % - -  06/07/18 1944 (!) 142/71 98.1 F (36.7 C) Oral 62 16 100 % - -  06/07/18 1840 124/77 97.6 F (36.4 C) Oral 63 16 100 % - -  06/07/18 1739 (!) 138/93 98.4 F (36.9 C) - - 16 100 % - -  06/07/18 1728 (!) 144/89 - - (!) 58 - 100 % - -  06/07/18 1715 - 98 F (36.7  C) - (!) 55 17 100 % - -  06/07/18 1700 138/80 - - (!) 55 19 100 % - -  06/07/18 1645 129/76 - - 65 19 100 % - -  06/07/18 1630 114/69 97.6 F (36.4 C) - 65 13 95 % - -  06/07/18 1315 - - - - - - 5' 9.75" (1.772 m) 241 lb (109.3 kg)  06/07/18 1258 (!) 168/76 98.4 F (36.9 C) Oral 93 20 96 % - -     Recent laboratory studies:  Recent Labs    06/08/18 0455  WBC 13.0*  HGB 13.4  HCT 39.9  PLT 314     Discharge Medications:   Allergies as of 06/08/2018   No Known Allergies     Medication List    TAKE these medications   acetaminophen 325 MG tablet Commonly known as:  TYLENOL Take 1-2 tablets (325-650 mg total) by mouth every 6 (six) hours as needed for mild pain (pain score 1-3 or temp > 100.5).   aspirin 81 MG chewable tablet Chew 1 tablet (81 mg total) by mouth 2 (two)  times daily. What changed:  Another medication with the same name was added. Make sure you understand how and when to take each.   aspirin 81 MG chewable tablet Chew 1 tablet (81 mg total) by mouth 2 (two) times daily. What changed:  You were already taking a medication with the same name, and this prescription was added. Make sure you understand how and when to take each.   Biotin 5000 MCG Tabs Take 5,000 mcg by mouth daily.   Fish Oil 500 MG Caps Take 500 mg by mouth daily.   Glucosamine Sulfate 1000 MG Tabs Take 1,000 mg by mouth daily.   ibuprofen 200 MG tablet Commonly known as:  ADVIL,MOTRIN Take 400 mg by mouth at bedtime.   methocarbamol 500 MG tablet Commonly known as:  ROBAXIN Take 1 tablet (500 mg total) by mouth every 6 (six) hours as needed for muscle spasms.   ONE-A-DAY 50 PLUS PO Take 1 tablet by mouth daily.   vitamin C 500 MG tablet Commonly known as:  ASCORBIC ACID Take 1,000 mg by mouth daily.   Vitamin D3 2000 units Tabs Take 2,000 Units by mouth daily.            Durable Medical Equipment  (From admission, onward)        Start     Ordered   06/07/18 1749  DME Walker rolling  Once    Question:  Patient needs a walker to treat with the following condition  Answer:  Status post total replacement of left hip   06/07/18 1748   06/07/18 1749  DME 3 n 1  Once     06/07/18 1748      Diagnostic Studies: Dg Chest 2 View  Result Date: 06/04/2018 CLINICAL DATA:  Previous thyroidectomy. Preoperative study prior to hip surgery this month. No current chest complaints. EXAM: CHEST - 2 VIEW COMPARISON:  None in PACs FINDINGS: The lungs are well-expanded. The interstitial markings are coarse. There is no alveolar infiltrate or pleural effusion. The heart and pulmonary vascularity are normal. 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 There surgical clips at the base of the neck on the right. There is calcification in the wall of the aortic arch.  The bony thorax exhibits no acute abnormality. IMPRESSION: No acute pneumonia nor CHF. Mild interstitial prominence bilaterally may reflect reactive airway disease or chronic bronchitis.  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 Reportedly the patient has undergone previous right thyroidectomy. Thyroid ultrasound is recommended. Thoracic aortic atherosclerosis. Electronically Signed   By: David  [place] M.D.   On: 06/04/2018 12:59   Dg Pelvis Portable  Result Date: 06/07/2018 CLINICAL DATA:  Status post left hip replacement. EXAM: PORTABLE PELVIS 1-2 VIEWS COMPARISON:  None. FINDINGS: Prior right total hip arthroplasty without failure or complication. Interval left total hip arthroplasty. Normal alignment. Small osseous fracture fragment from the periphery of the superolateral acetabulum likely postsurgical. No other acute fracture or dislocation. Postsurgical changes in the soft tissues overlying the left hip. Generalized osteopenia. Mild osteoarthritis of bilateral sacroiliac joints. IMPRESSION: Interval left total hip arthroplasty. Electronically Signed   By: Elige Ko   On: 06/07/2018 16:58   Dg C-arm 1-60 Min-no Report  Result Date: 06/07/2018 Fluoroscopy was utilized by the requesting physician.  No radiographic interpretation.   Dg Hip Operative Unilat W Or W/o Pelvis Left  Result Date: 06/07/2018 CLINICAL DATA:  Left hip arthroplasty. EXAM: OPERATIVE LEFT HIP (WITH PELVIS IF PERFORMED) 2 VIEWS TECHNIQUE: Fluoroscopic spot image(s) were submitted for interpretation post-operatively. COMPARISON:  None. FINDINGS: Interval left total hip arthroplasty without failure or complication. Normal alignment. No fracture or dislocation. IMPRESSION: Interval left total hip arthroplasty. Electronically Signed   By: Elige Ko   On: 06/07/2018 16:19    Disposition: Discharge disposition: 01-Home or Self  Care       Discharge Instructions    Discharge patient   Complete by:  As directed    Discharge disposition:  01-Home or Self Care   Discharge patient date:  06/08/2018      Follow-up Information    Kathryne Hitch, MD. Schedule an appointment as soon as possible for a visit in 2 week(s).   Specialty:  Orthopedic Surgery Contact information: [address] [place] [address] [phone]            Signed: Kathryne Hitch 06/08/2018, 10:22 AM

## 2018-06-08 NOTE — Discharge Instructions (Signed)
Increase your activities as comfort allows. You can get your current dressing wet in the shower daily. You can change your dressing in 5 days.

## 2018-06-08 NOTE — Evaluation (Signed)
Physical Therapy Evaluation Patient Details Name: Seth Wood MRN: [id number] DOB: 10/05/1956 Today's Date: 06/08/2018   History of Present Illness  62 y.o. male admitte for elective left direct anterior THA. WBAT post op.  Post  RTHA 4/19. Pt with significant PMH of  goiter, arthritis, and thyroidectomy.  Clinical Impression  The patient ambulated well. Cues for safety and sequence on steps due to now opposite limb. Pt admitted with above diagnosis. Pt currently with functional limitations due to the deficits listed below (see PT Problem List).  Pt will benefit from skilled PT to increase their independence and safety with mobility to allow discharge to the venue listed below.       Follow Up Recommendations No PT follow up    Equipment Recommendations  None recommended by PT    Recommendations for Other Services       Precautions / Restrictions Precautions Precautions: Fall      Mobility  Bed Mobility Overal bed mobility: Needs Assistance Bed Mobility: Supine to Sit     Supine to sit: Min guard     General bed mobility comments: used  belt to self assist left leg.  Transfers Overall transfer level: Needs assistance Equipment used: Rolling walker (2 wheeled) Transfers: Sit to/from Stand Sit to Stand: Min assist         General transfer comment: cues for  leg and hand position  Ambulation/Gait Ambulation/Gait assistance: Min assist Gait Distance (Feet): 100 Feet Assistive device: Rolling walker (2 wheeled) Gait Pattern/deviations: Step-to pattern;Step-through pattern     General Gait Details: cues for posture  Stairs Stairs: Yes Stairs assistance: Min assist Stair Management: Step to pattern;Forwards;With cane Number of Stairs: 2 General stair comments: x 2  practice, has a  column to hold  Wheelchair Mobility    Modified Rankin (Stroke Patients Only)       Balance                                             Pertinent  Vitals/Pain Pain Assessment: 0-10 Pain Location: left hip sore Pain Descriptors / Indicators: Burning;Discomfort Pain Intervention(s): Premedicated before session;Monitored during session;Repositioned;Ice applied    Home Living Family/patient expects to be discharged to:: Private residence Living Arrangements: Spouse/significant other Available Help at Discharge: Family;Available 24 hours/day Type of Home: House Home Access: Stairs to enter Entrance Stairs-Rails: None Entrance Stairs-Number of Steps: 2 Home Layout: One level Home Equipment: Cane - single point;Walker - 2 wheels Additional Comments: Pt sleeps in a recliner    Prior Function Level of Independence: Independent         Comments: drives, 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     Hand Dominance        Extremity/Trunk Assessment   Upper Extremity Assessment Upper Extremity Assessment: Overall WFL for tasks assessed    Lower Extremity Assessment Lower Extremity Assessment: LLE deficits/detail LLE Deficits / Details: advances the leg       Communication   Communication: No difficulties  Cognition Arousal/Alertness: Awake/alert Behavior During Therapy: WFL for tasks assessed/performed Overall Cognitive Status: Within Functional Limits for tasks assessed                                        General Comments      Exercises Total  Joint Exercises Ankle Circles/Pumps: AROM;Both;10 reps Quad Sets: AROM;Both;10 reps Short Arc Quad: AROM;Left;10 reps Heel Slides: AAROM;Left;10 reps Hip ABduction/ADduction: AAROM;Left;10 reps   Assessment/Plan    PT Assessment Patient needs continued PT services  PT Problem List Decreased strength;Decreased range of motion;Decreased activity tolerance;Decreased mobility;Decreased knowledge of precautions;Decreased knowledge of use of DME;Decreased safety awareness;Pain       PT Treatment Interventions DME instruction;Therapeutic exercise;Gait training;Stair  training;Therapeutic activities;Functional mobility training;Patient/family education    PT Goals (Current goals can be found in the Care Plan section)  Acute Rehab PT Goals Patient Stated Goal: go home PT Goal Formulation: With patient Time For Goal Achievement: 06/10/18 Potential to Achieve Goals: Good    Frequency 7X/week   Barriers to discharge        Co-evaluation               AM-PAC PT "6 Clicks" Daily Activity  Outcome Measure Difficulty turning over in bed (including adjusting bedclothes, sheets and blankets)?: A Little Difficulty moving from lying on back to sitting on the side of the bed? : A Little Difficulty sitting down on and standing up from a chair with arms (e.g., wheelchair, bedside commode, etc,.)?: A Little Help needed moving to and from a bed to chair (including a wheelchair)?: A Little Help needed walking in hospital room?: A Little Help needed climbing 3-5 steps with a railing? : A Lot 6 Click Score: 17    End of Session   Activity Tolerance: Patient tolerated treatment well Patient left: with call bell/phone within reach Nurse Communication: Mobility status PT Visit Diagnosis: Unsteadiness on feet (R26.81);Pain Pain - Right/Left: Left Pain - part of body: Hip    Time: 1610-9604 PT Time Calculation (min) (ACUTE ONLY): 45 min   Charges:   PT Evaluation $PT Eval Low Complexity: 1 Low PT Treatments $Gait Training: 8-22 mins $Therapeutic Exercise: 8-22 mins   PT G CodesBlanchard Kelch PT [phone]   Rada Hay 06/08/2018, 2:11 PM

## 2018-06-08 NOTE — Progress Notes (Signed)
Physical Therapy Treatment Patient Details Name: Seth GowerBenny R Wood MRN: [id number] DOB: 09-06-1956 Today's Date: 06/08/2018    History of Present Illness 62 y.o. male admitted for elective left direct anterior THA. WBAT post op.  Post  RTHA 4/19. Pt with significant PMH of  goiter, arthritis, and thyroidectomy.    PT Comments    Ready for DC.   Follow Up Recommendations  No PT follow up     Equipment Recommendations  None recommended by PT    Recommendations for Other Services       Precautions / Restrictions Precautions Precautions: Fall    Mobility  Bed Mobility Overal bed mobility: Needs Assistance Bed Mobility: Supine to Sit     Supine to sit: Min guard     General bed mobility comments: in recliner, uses belt to lower leg  Transfers Overall transfer level: Needs assistance Equipment used: Rolling walker (2 wheeled) Transfers: Sit to/from Stand Sit to Stand: Supervision         General transfer comment: cues for  leg and hand position  Ambulation/Gait Ambulation/Gait assistance: Supervision Gait Distance (Feet): 100 Feet Assistive device: Rolling walker (2 wheeled) Gait Pattern/deviations: Step-to pattern;Step-through pattern     General Gait Details: cues for posture   Stairs Stairs: Yes Stairs assistance: Min assist Stair Management: Step to pattern;Forwards;With cane Number of Stairs: 2 General stair comments: x 2  practice, has a  column to hold   Wheelchair Mobility    Modified Rankin (Stroke Patients Only)       Balance                                            Cognition Arousal/Alertness: Awake/alert Behavior During Therapy: WFL for tasks assessed/performed Overall Cognitive Status: Within Functional Limits for tasks assessed                                        Exercises Total Joint Exercises Seated hip flexion with towel    General Comments        Pertinent Vitals/Pain Pain  Assessment: 0-10 Pain Score: 3  Pain Location: left hip sore Pain Descriptors / Indicators: Burning;Discomfort Pain Intervention(s): Monitored during session;Premedicated before session    Home Living Family/patient expects to be discharged to:: Private residence Living Arrangements: Spouse/significant other Available Help at Discharge: Family;Available 24 hours/day Type of Home: House Home Access: Stairs to enter Entrance Stairs-Rails: None Home Layout: One level Home Equipment: Cane - single point;Walker - 2 wheels Additional Comments: Pt sleeps in a recliner    Prior Function Level of Independence: Independent      Comments: drives, works full time desk mostly   PT Goals (current goals can now be found in the care plan section) Acute Rehab PT Goals Patient Stated Goal: go home PT Goal Formulation: With patient Time For Goal Achievement: 06/10/18 Potential to Achieve Goals: Good Progress towards PT goals: Progressing toward goals    Frequency    7X/week      PT Plan Current plan remains appropriate    Co-evaluation              AM-PAC PT "6 Clicks" Daily Activity  Outcome Measure  Difficulty turning over in bed (including adjusting bedclothes, sheets and blankets)?: A Little Difficulty moving from lying on  back to sitting on the side of the bed? : A Little Difficulty sitting down on and standing up from a chair with arms (e.g., wheelchair, bedside commode, etc,.)?: A Little Help needed moving to and from a bed to chair (including a wheelchair)?: A Little Help needed walking in hospital room?: A Little Help needed climbing 3-5 steps with a railing? : A Lot 6 Click Score: 17    End of Session   Activity Tolerance: Patient tolerated treatment well Patient left: in chair;with call bell/phone within reach;with family/visitor present Nurse Communication: Mobility status PT Visit Diagnosis: Unsteadiness on feet (R26.81);Pain Pain - Right/Left: Left Pain -  part of body: Hip     Time: 1205-1220 PT Time Calculation (min) (ACUTE ONLY): 15 min  Charges:  $Gait Training: 8-22 mins $Therapeutic Exercise: 8-22 mins                    G Codes:          Rada Hay 06/08/2018, 2:14 PM

## 2018-06-08 NOTE — Progress Notes (Signed)
Patient ID: Seth Wood, male   DOB: 03/13/1956, 62 y.o.   MRN: [id number] Doing well overall.  Can be discharged to home today.  Does not need HHPT.

## 2018-06-08 NOTE — Care Management Note (Signed)
Case Management Note  Patient Details  Name: Seth Wood MRN: [id number] Date of Birth: August 20, 1956  Subjective/Objective: 62 yo M s/p L THA                 Action/Plan: Received referral to assist with [facility] and DME   Expected Discharge Date:  06/08/18               Expected Discharge Plan:  Home/Self Care  In-House Referral:     Discharge planning Services  CM Consult  Post Acute Care Choice:    Choice offered to:     DME Arranged:    DME Agency:     HH Arranged:    [place] Agency:     Status of Service:  Completed, signed off  If discussed at [employer] of Stay Meetings, dates discussed:    Additional Comments: Met with pt and wife. He plans to return home with the support of his wife. He has DME from previous surgery. He reports that he had hip surgery in April and he can do his own exercise, and he doesn't need HH PT. Spoke with Alwyn Ren at [facility] [place] [facility] and made her aware that pt doesn't need HHPT.  Norina Buzzard, RN 06/08/2018, 12:40 PM

## 2018-06-10 ENCOUNTER — Encounter (HOSPITAL_COMMUNITY): Payer: Self-pay | Admitting: Orthopaedic Surgery

## 2018-06-10 ENCOUNTER — Ambulatory Visit (INDEPENDENT_AMBULATORY_CARE_PROVIDER_SITE_OTHER): Payer: BC Managed Care – PPO | Admitting: Orthopaedic Surgery

## 2018-06-11 ENCOUNTER — Telehealth (INDEPENDENT_AMBULATORY_CARE_PROVIDER_SITE_OTHER): Payer: Self-pay

## 2018-06-11 NOTE — Telephone Encounter (Signed)
Email from patient:  You did my left hip replacement Friday (7/12). Unlike the right hip replacement that you did on 4/23 (twelve weeks ago), this one burns more around the cut and thigh area, though today (Monday) it seems a little better in this. But the knee area is really swollen: the one in April had swelling in the foot-ankle area. Should I have any concerns?  Nat ChristenBenny Hornbeck

## 2018-06-11 NOTE — Telephone Encounter (Signed)
This is all normal after total hip replacement surgery.  It can effect patients differently on each side.

## 2018-06-11 NOTE — Telephone Encounter (Signed)
Talked with patient and advised him of message below per Dr. Blackman. 

## 2018-06-11 NOTE — Telephone Encounter (Signed)
Could you please advise on message below.  Thank you. 

## 2018-06-12 ENCOUNTER — Ambulatory Visit (INDEPENDENT_AMBULATORY_CARE_PROVIDER_SITE_OTHER): Payer: Self-pay | Admitting: Physical Medicine and Rehabilitation

## 2018-06-12 ENCOUNTER — Encounter

## 2018-06-24 ENCOUNTER — Encounter (INDEPENDENT_AMBULATORY_CARE_PROVIDER_SITE_OTHER): Payer: Self-pay | Admitting: Orthopaedic Surgery

## 2018-06-24 ENCOUNTER — Telehealth (INDEPENDENT_AMBULATORY_CARE_PROVIDER_SITE_OTHER): Payer: Self-pay | Admitting: Orthopaedic Surgery

## 2018-06-24 ENCOUNTER — Inpatient Hospital Stay (INDEPENDENT_AMBULATORY_CARE_PROVIDER_SITE_OTHER): Payer: BC Managed Care – PPO | Admitting: Orthopaedic Surgery

## 2018-06-24 ENCOUNTER — Ambulatory Visit (INDEPENDENT_AMBULATORY_CARE_PROVIDER_SITE_OTHER): Payer: BC Managed Care – PPO | Admitting: Orthopaedic Surgery

## 2018-06-24 DIAGNOSIS — Z96642 Presence of left artificial hip joint: Secondary | ICD-10-CM

## 2018-06-24 NOTE — Telephone Encounter (Signed)
He can stop taking his aspirin.

## 2018-06-24 NOTE — Progress Notes (Signed)
The patient is here for his first postoperative visit status post a left total hip arthroplasty.  He is also 3 months status post a right total hip arthroplasty.  He is ambulate with a cane and doing well overall.  He had more bruising and swelling in this side than he did on the opposite side.  On examination does have bruising around his left knee.  This was a more difficult case due to his previous knee injury and issues.  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  There was degenerative labral tear but he had bone-on-bone arthritis in the left hip joint itself.  On examination incision looks good.  He is walking with a cane.  His leg lengths appear equal.  This point he will continue increase his activities as comfort allows.  We will see him back in 4 weeks to see how is doing overall but no x-rays are needed.  I gave him reassurance that I do feel his knee will get better as well.

## 2018-06-24 NOTE — Telephone Encounter (Signed)
Patient called left voicemail message asking if he is suppose to stop taking aspirin. The number to contact patient is [phone]

## 2018-06-25 NOTE — Telephone Encounter (Signed)
Spoke with patient via email letting him know he ca DC aspirin at this time

## 2018-06-26 ENCOUNTER — Telehealth (INDEPENDENT_AMBULATORY_CARE_PROVIDER_SITE_OTHER): Payer: Self-pay

## 2018-06-26 NOTE — Telephone Encounter (Signed)
Seth Wood, please share the following with Dr. Magnus IvanBlackman.   Dr. 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 This is 3 weeks from now. Then (August 19) it will be 5 1/2 weeks from my hip replacement that you did on July 12. Although when I return on August 19, I will have missed NO days of work, do you know if I still must have the return to work form completed by you for my employer? My employer required this for my other hip replacement that was done on 4/23 before I could return to work on 5/28; for during the time that I was off on "work leave," they had to pay me and pay someone else to work in my position during those weeks. Although this will not be the case for this hip replacement since it has been done while I am off in the summer, I am wondering if I still must have this one also "cleared" before returning on August 19 because I am still paid during the summer while I am off. I mean, I am still on their payroll. So, again, do you have any experience in this situation and thus know if I have to have this one "cleared" too before returning to work on August 19?

## 2018-06-26 NOTE — Telephone Encounter (Signed)
He should be fine returning to work then.  I am always happy to give a work note for return if the patient needs one.  If her does not need one, that is fine as well.  It is really up to what his work requires in terms of any release note.

## 2018-07-08 NOTE — Telephone Encounter (Signed)
Patient called to check on  the status of work note,Patient left Voicemail would like to speak with Morrie SheldonAshley.

## 2018-07-09 NOTE — Telephone Encounter (Signed)
  Ok to give note stating that he has been recovering from surgery and is cleared to return to work.

## 2018-07-15 ENCOUNTER — Telehealth (INDEPENDENT_AMBULATORY_CARE_PROVIDER_SITE_OTHER): Payer: Self-pay

## 2018-07-15 NOTE — Telephone Encounter (Signed)
I have been doing the ice and elevation.  Still, this "season of recovering from this hip replacement surgery" is really different and frustrating in relation to the one done in April. I mean, today will be 5 weeks and 3 days out from surgery, yet I still have some "grabbing pain" in the hip area when I walk without the cane. Yet with the one in April, I put away the cane completely at 4 weeks and 3 days and had NO "grabbing pain" in that hip area. Period. In fact, then at the morning of 4 weeks and 3 days I just "took off walking in a straight line" like I hadn't even had the hip replacement. This is confusing and discouraging when comparing "that" hip replacement with "this" hip replacement. I continue to struggle in my mind, for it continues to try to entertain the thoughts that something is wrong with "this" hip replacement.

## 2018-07-15 NOTE — Telephone Encounter (Signed)
Re-assure him that this can be normal and all patients respond differently.  I am happy to see him in follow-up earlier for new x-rays and reassurance if needed.

## 2018-07-22 ENCOUNTER — Ambulatory Visit (INDEPENDENT_AMBULATORY_CARE_PROVIDER_SITE_OTHER): Payer: BC Managed Care – PPO | Admitting: Orthopaedic Surgery

## 2018-07-22 ENCOUNTER — Encounter (INDEPENDENT_AMBULATORY_CARE_PROVIDER_SITE_OTHER): Payer: Self-pay | Admitting: Orthopaedic Surgery

## 2018-07-22 DIAGNOSIS — Z96642 Presence of left artificial hip joint: Secondary | ICD-10-CM

## 2018-07-22 NOTE — Progress Notes (Signed)
The patient is now 6 weeks status post a left total hip arthroplasty and 4 months status post a right total hip arthroplasty.  Left hip is taken longer to get over in his opinion.  I gave him reassurance that this is absolutely normal to have such significant pain after a joint replacement in one side can always be harder on the patient is other side.  He is ambulating without a cane now he seems to be doing well and he just needs a lot of reassurance that things are going to do well.  Overall though he does feel like he is finally making some progress.  He is just frustrated that is taking longer than what he did with his right side.  On exam both hips move fluidly.  He does have more swelling on his left lower extremity but no evidence of a blood clot.  This is just postoperative swelling.  This point he will continue to increase his activities.  I will not need to see him back for 6 months unless he is having issues.  At that visit I would like a standing low AP pelvis and the lateral of both right and left hips.

## 2018-08-12 ENCOUNTER — Other Ambulatory Visit (INDEPENDENT_AMBULATORY_CARE_PROVIDER_SITE_OTHER): Payer: Self-pay

## 2018-08-12 ENCOUNTER — Telehealth (INDEPENDENT_AMBULATORY_CARE_PROVIDER_SITE_OTHER): Payer: Self-pay | Admitting: Orthopaedic Surgery

## 2018-08-12 MED ORDER — AMOXICILLIN 500 MG PO TABS: ORAL_TABLET | ORAL | 0 refills | Status: AC

## 2018-08-12 NOTE — Telephone Encounter (Signed)
See below

## 2018-08-12 NOTE — Telephone Encounter (Signed)
Patient called left voicemail message stating he is suppose to have a procedure today at 3:30pm for an ingrown toe nail. Patient asked if he should wait on having the procedure in case he has an infection which could effect his hip replacements. The number to contact patient  Is [phone]

## 2018-08-12 NOTE — Telephone Encounter (Signed)
As long as they give him an antibiotic, that will be fine having the procedure.

## 2018-08-12 NOTE — Telephone Encounter (Signed)
Sent an email to patient of the below message

## 2018-08-19 ENCOUNTER — Telehealth (INDEPENDENT_AMBULATORY_CARE_PROVIDER_SITE_OTHER): Payer: Self-pay

## 2018-08-19 NOTE — Telephone Encounter (Signed)
Email from [place]  Dushaun Okey, I hate to keep emailing you guys. But I continue to notice that when/after sitting at a desk (or NOT sitting in a reclining position), my lower left leg (above my ankle but below my knee) continues to swell quite a bit. By "swell," I mean there is an "indention" that goes inward (like a crevice in my skin). If Dr. Magnus IvanBlackman has a work cell phone, I could take a photo of it when it does this and send it to him. Although it always goes away, it really looks "bad" before it does. Since it has now been 10 weeks since this hip replacement (same side of this lower left leg), I am wondering if this is still "normal." Therefore, I am emailing and inquiring.  Nat ChristenBenny Farkas  P.S. Again, the healing and "funny feeling" of this left hip is nothing like the right one, or the first one done.

## 2018-08-19 NOTE — Telephone Encounter (Signed)
A picture is not needed.  This, swelling can still be normal.  He needs to try a compressive garment on that leg as well as at times elevation.  We can always send him for a lower extremity Doppler ultrasound of the left lower extremity to rule out a blood clot which does not seem to be unreasonable.

## 2018-08-26 ENCOUNTER — Telehealth (INDEPENDENT_AMBULATORY_CARE_PROVIDER_SITE_OTHER): Payer: Self-pay

## 2018-08-26 ENCOUNTER — Other Ambulatory Visit (INDEPENDENT_AMBULATORY_CARE_PROVIDER_SITE_OTHER): Payer: Self-pay

## 2018-08-26 DIAGNOSIS — M79605 Pain in left leg: Secondary | ICD-10-CM

## 2018-08-26 NOTE — Telephone Encounter (Signed)
From patient email   Everytime after I get up from sitting in a reclining position, a crease continues to form above my left ankle from the middle of this lower leg area towards the back of this lower leg area. It NEVER hurts; and after standing and walking on it for a few minutes, it ALWAYS goes away and returns to looking "normal." This same above cycle has continued for the past several weeks, and it started doing this only after I had the left hip replaced 11 weeks & 3 days ago. Therefore if Dr. Rayburn Ma thinks I should have an ultrasound on this area, please schedule this for me at the [facility] for any day at 3:30 in the afternoon.  Thank you.  Seth Wood

## 2018-08-26 NOTE — Telephone Encounter (Signed)
Patient aware we sent order for this and he will be waiting a call

## 2018-08-26 NOTE — Telephone Encounter (Signed)
I would send him for an ultrasound of the left leg to rule out a DVT.

## 2018-09-02 ENCOUNTER — Telehealth (INDEPENDENT_AMBULATORY_CARE_PROVIDER_SITE_OTHER): Payer: Self-pay

## 2018-09-02 NOTE — Telephone Encounter (Signed)
Patient states he still hasn't heard anything about his doppler Dr. Magnus Ivan wanted to have on him He wants it in [place]

## 2018-09-02 NOTE — Telephone Encounter (Signed)
Pt is scheduled at [facility] imaging Oct 8 at 3:30pm check in appt at 4:00pm pt is aware of appt and order has been faxed to [phone]

## 2018-09-03 ENCOUNTER — Telehealth (INDEPENDENT_AMBULATORY_CARE_PROVIDER_SITE_OTHER): Payer: Self-pay | Admitting: Orthopaedic Surgery

## 2018-09-03 NOTE — Telephone Encounter (Signed)
Inetta Fermo from [facility] radiology left a message stating that they need a signed order for the patient Ultrasound Doppler that is scheduled for 4:00 p.m. Today.  Please fax it to [phone].  Thank you [phone]

## 2018-09-03 NOTE — Telephone Encounter (Signed)
This was faxed

## 2018-09-03 NOTE — Telephone Encounter (Signed)
Is there a way you usually do this?

## 2018-09-16 ENCOUNTER — Telehealth (INDEPENDENT_AMBULATORY_CARE_PROVIDER_SITE_OTHER): Payer: Self-pay

## 2018-09-16 NOTE — Telephone Encounter (Signed)
Emailed this below message to patient

## 2018-09-16 NOTE — Telephone Encounter (Signed)
We usually do not order chest x-rays in the preoperative setting.  We do leave this up to the anesthesiologist and whoever is reviewing his case in short stay.  That x-ray is always left to them and that is not something that we ourselves order.  He would need to follow-up with why they wanted this x-ray.  It may be just some anesthesiologist like to have a preop chest x-ray just to make sure there is no significant lung disease that would be worrisome for general anesthesia if that is needed for surgery such as this.

## 2018-09-16 NOTE — Telephone Encounter (Signed)
I received a phone call about an x-ray that [facility] did on my chest right before Dr. Rayburn Ma replaced my second hip on July 12. Since [facility] did not do this on April 23 (11 weeks earlier) when Dr. Rayburn Ma did my first hip, why did Dr. Rayburn Ma order a chest x-ray right before doing my second hip?  Seth Wood

## 2018-09-19 ENCOUNTER — Telehealth (INDEPENDENT_AMBULATORY_CARE_PROVIDER_SITE_OTHER): Payer: Self-pay

## 2018-09-19 NOTE — Telephone Encounter (Signed)
On November 4th I will be having a biopsy done on my left thyroid lobe, for I have a multinodular goiter there. On November 4th when I have the biopsy, it will be 16 weeks and 3 days since my hip replacement that Dr. Rayburn Ma did on July 12th. So for this biopsy do I need to take an antibiotic for a possible infection in my hip replacement?  Seth Wood

## 2018-09-19 NOTE — Telephone Encounter (Signed)
Patient aware of the below message  

## 2018-09-19 NOTE — Telephone Encounter (Signed)
He does not need an antibiotic from my standpoint. 

## 2019-01-22 ENCOUNTER — Ambulatory Visit (INDEPENDENT_AMBULATORY_CARE_PROVIDER_SITE_OTHER): Payer: BC Managed Care – PPO | Admitting: Orthopaedic Surgery

## 2019-01-22 ENCOUNTER — Ambulatory Visit (INDEPENDENT_AMBULATORY_CARE_PROVIDER_SITE_OTHER): Payer: BC Managed Care – PPO

## 2019-01-22 ENCOUNTER — Encounter (INDEPENDENT_AMBULATORY_CARE_PROVIDER_SITE_OTHER): Payer: Self-pay | Admitting: Orthopaedic Surgery

## 2019-01-22 ENCOUNTER — Telehealth (INDEPENDENT_AMBULATORY_CARE_PROVIDER_SITE_OTHER): Payer: Self-pay | Admitting: Orthopaedic Surgery

## 2019-01-22 DIAGNOSIS — M25562 Pain in left knee: Secondary | ICD-10-CM

## 2019-01-22 DIAGNOSIS — Z96642 Presence of left artificial hip joint: Secondary | ICD-10-CM

## 2019-01-22 DIAGNOSIS — Z96641 Presence of right artificial hip joint: Secondary | ICD-10-CM | POA: Diagnosis not present

## 2019-01-22 DIAGNOSIS — M25561 Pain in right knee: Secondary | ICD-10-CM

## 2019-01-22 DIAGNOSIS — M7701 Medial epicondylitis, right elbow: Secondary | ICD-10-CM

## 2019-01-22 DIAGNOSIS — M1711 Unilateral primary osteoarthritis, right knee: Secondary | ICD-10-CM

## 2019-01-22 DIAGNOSIS — M1712 Unilateral primary osteoarthritis, left knee: Secondary | ICD-10-CM | POA: Insufficient documentation

## 2019-01-22 DIAGNOSIS — M7702 Medial epicondylitis, left elbow: Secondary | ICD-10-CM

## 2019-01-22 NOTE — Telephone Encounter (Signed)
Pharmacy  [facility]    BI HIP REPLACEMENT     Patient called forgot to ask Dr.Blackman will he need an antibiotic for his dental appointment tomorrow. Please call patient to advise

## 2019-01-22 NOTE — Progress Notes (Signed)
Office Visit Note   Patient: Seth Wood           Date of Birth: 06/16/1956           MRN: [id number] Visit Date: 01/22/2019              Requested by: Katherine Basset, PA-C [address] [place], [place] [address] PCP: Katherine Basset, PA-C   Assessment & Plan: Visit Diagnoses:  1. Status post total replacement of left hip   2. Status post total replacement of right hip   3. Acute pain of both knees   4. Medial epicondylitis of both elbows   5. Unilateral primary osteoarthritis, left knee   6. Unilateral primary osteoarthritis, right knee     Plan: I do not feel that the thyroid disease and thyroid medications are contributing to his knee pain or his medial epicondylitis.  Both of these I think are activity related and the knee certainly have significant arthritis to the point that he will potentially need some type of intervention in the future which would be as minimal steroid injections and hyaluronic acid injections to likely knee replacements given the profound severity of the disease specially on the left knee.  I showed him these things and tried explained in detail that I think this is just a coincidence with the Synthroid but the pains here feeling are legitimate pains for sure.  I showed him stretching exercises to try.  All questions concerns were answered and addressed.  The hips are doing well.  At this point follow-up will be as needed unless he ends up needing something for his knees.  Follow-Up Instructions: Return if symptoms worsen or fail to improve.   Orders:  Orders Placed This Encounter  Procedures  . XR Pelvis 1-2 Views  . XR KNEE 3 VIEW LEFT  . XR Knee 1-2 Views Right   No orders of the defined types were placed in this encounter.     Procedures: No procedures performed   Clinical Data: No additional findings.   Subjective: Chief Complaint  Patient presents with  . Left Knee - Pain  . Right Hip - Follow-up  . Left Hip - Follow-up    . Right Knee - Pain  The patient is well-known to me.  He has a history of both his hips being replaced with the left one being the most recent one at 8 months ago.  Since then he has had his thyroid removed about 8 weeks ago.  Since then he has been exercising quite a bit and he reports bilateral elbow pain and bilateral knee pain which he said was not as bad as it was since he has been on Synthroid.  He has been working out quite a bit.  He points to the medial epicondyle area of both elbows a source of his pain in the medial joint line of both knees a source of his pain.  40 years ago he did have ligamentous reconstruction of his left knee.  Both knees hurt with activities but are not severe.  He denies any numbness tingling in his hands or feet.  HPI  Review of Systems He currently denies any headache, chest pain, shortness of breath, fever, chills, nausea, vomiting  Objective: Vital Signs: There were no vitals taken for this visit.  Physical Exam He is alert and orient x3 and in no acute distress Ortho Exam Examination of both his elbow shows pain over the medial epicondyle area but otherwise  a normal exam of both elbows.  This is consistent with medial epicondylitis examination of both his hips shows fluid and full range of motion.  Examination of both knees shows varus malalignment with significant medial joint line tenderness of both knees and some patellofemoral crepitation. Specialty Comments:  No specialty comments available.  Imaging: Xr Knee 1-2 Views Right  Result Date: 01/22/2019 2 views of the right knee show significant osteoarthritis with varus malalignment.  There is considerable narrowing of the medial joint line.  There is particular osteophytes in all 3 compartments.  Xr Knee 3 View Left  Result Date: 01/22/2019 X-rays of the left knee show severe tricompartmental arthritic changes.  There is varus malalignment.  There is previous hardware in the lateral compartment  of his knee.  There is complete loss of the medial joint space.  There are large para-articular osteophytes in all 3 compartments.  Xr Pelvis 1-2 Views  Result Date: 01/22/2019 An AP pelvis shows bilateral total hip arthroplasties with no complicating features.    PMFS History: Patient Active Problem List   Diagnosis Date Noted  . Unilateral primary osteoarthritis, left knee 01/22/2019  . Unilateral primary osteoarthritis, right knee 01/22/2019  . Status post total replacement of left hip 06/07/2018  . Unilateral primary osteoarthritis, left hip 05/14/2018  . Severe left groin pain 04/25/2018  . Status post total replacement of right hip 03/19/2018  . Unilateral primary osteoarthritis, right hip 02/21/2018   Past Medical History:  Diagnosis Date  . Benign nodular prostatic hyperplasia with lower urinary tract symptoms   . Bicipital tendinitis    Right shoulder  . Boil    Under right arm  . Complication of anesthesia    pt had too much anesthesia with hernia surgery and had nausea/vomiting  . Goiter    hx  . History of bronchitis    no problem in years  . History of colon polyps   . Hyperlipidemia   . Impingement syndrome of right shoulder   . Inguinal hernia    Right  . Left lumbar radiculopathy   . Low back pain   . Numbness and tingling of right arm   . OA (osteoarthritis)    "right hip" (03/19/2018)  . PONV (postoperative nausea and vomiting)   . Rectal pain   . Right inguinal hernia   . Vitamin D deficiency     History reviewed. No pertinent family history.  Past Surgical History:  Procedure Laterality Date  . COLONOSCOPY  09/23/2018  . INGUINAL HERNIA REPAIR Right   . REPAIR KNEE LIGAMENT Left 1977   "repair of torn ligament; put pin in it"  . THYROIDECTOMY, PARTIAL Right 2003  . TOTAL HIP ARTHROPLASTY Right 03/19/2018   Procedure: RIGHT TOTAL HIP ARTHROPLASTY ANTERIOR APPROACH;  Surgeon: Kathryne Hitch, MD;  Location: [facility] OR;  Service: Orthopedics;   Laterality: Right;  . TOTAL HIP ARTHROPLASTY Left 06/07/2018   Procedure: LEFT TOTAL HIP ARTHROPLASTY ANTERIOR APPROACH;  Surgeon: Kathryne Hitch, MD;  Location: [facility] ORS;  Service: Orthopedics;  Laterality: Left;   Social History   Occupational History  . Not on file  Tobacco Use  . Smoking status: Never Smoker  . Smokeless tobacco: Never Used  Substance and Sexual Activity  . Alcohol use: Not Currently    Frequency: Never  . Drug use: Never  . Sexual activity: Yes

## 2019-01-23 NOTE — Telephone Encounter (Signed)
LMOM for patient letting him know he no longer needs antibiotics

## 2019-02-08 IMAGING — CR DG CHEST 2V
2 series · 2 of 2 positions shown · non-contrast
Comparison: None in PACs

CLINICAL DATA: Previous thyroidectomy. Preoperative study prior to
hip surgery this month. No current chest complaints.

EXAM:
CHEST - 2 VIEW

[w chest pa]
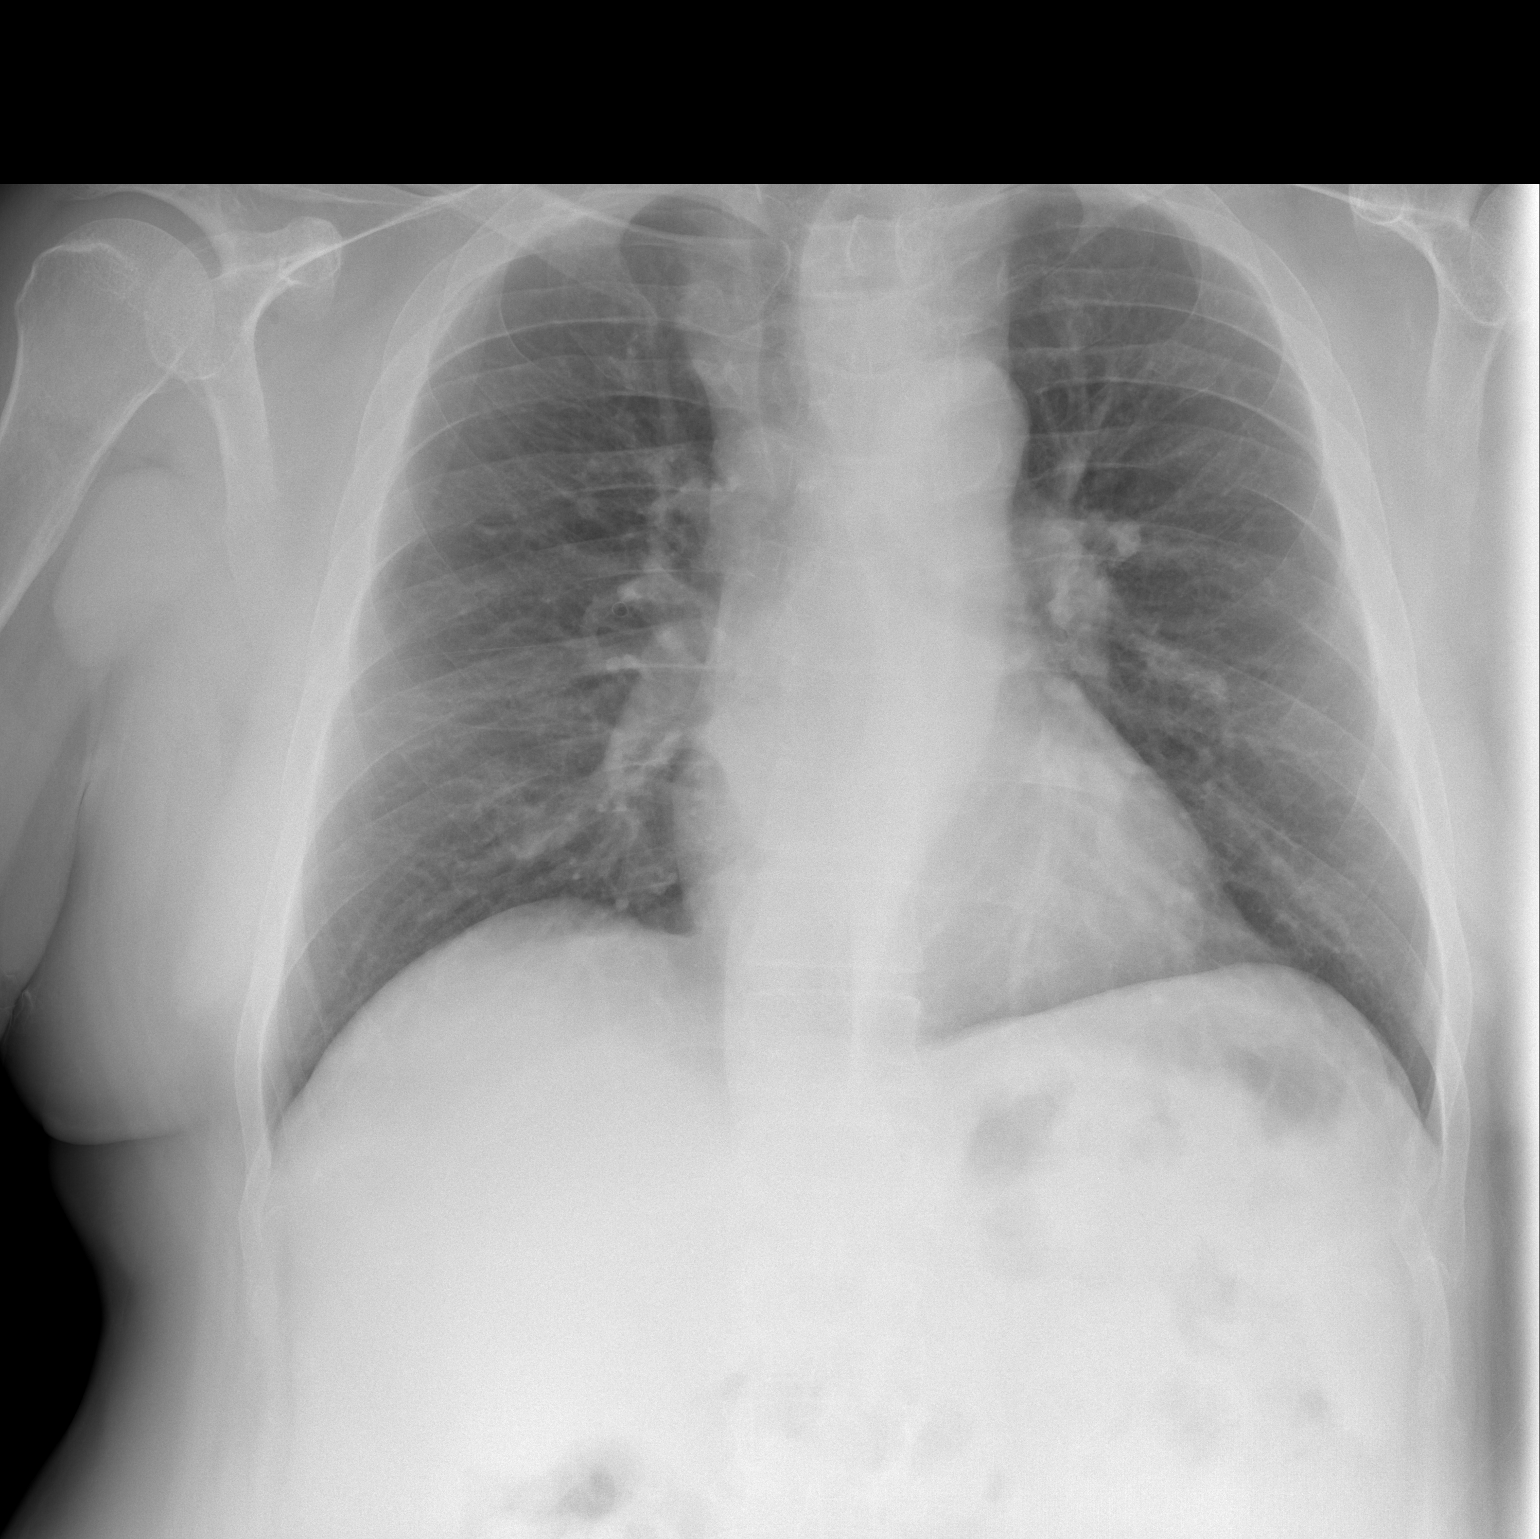

[w chest lat]
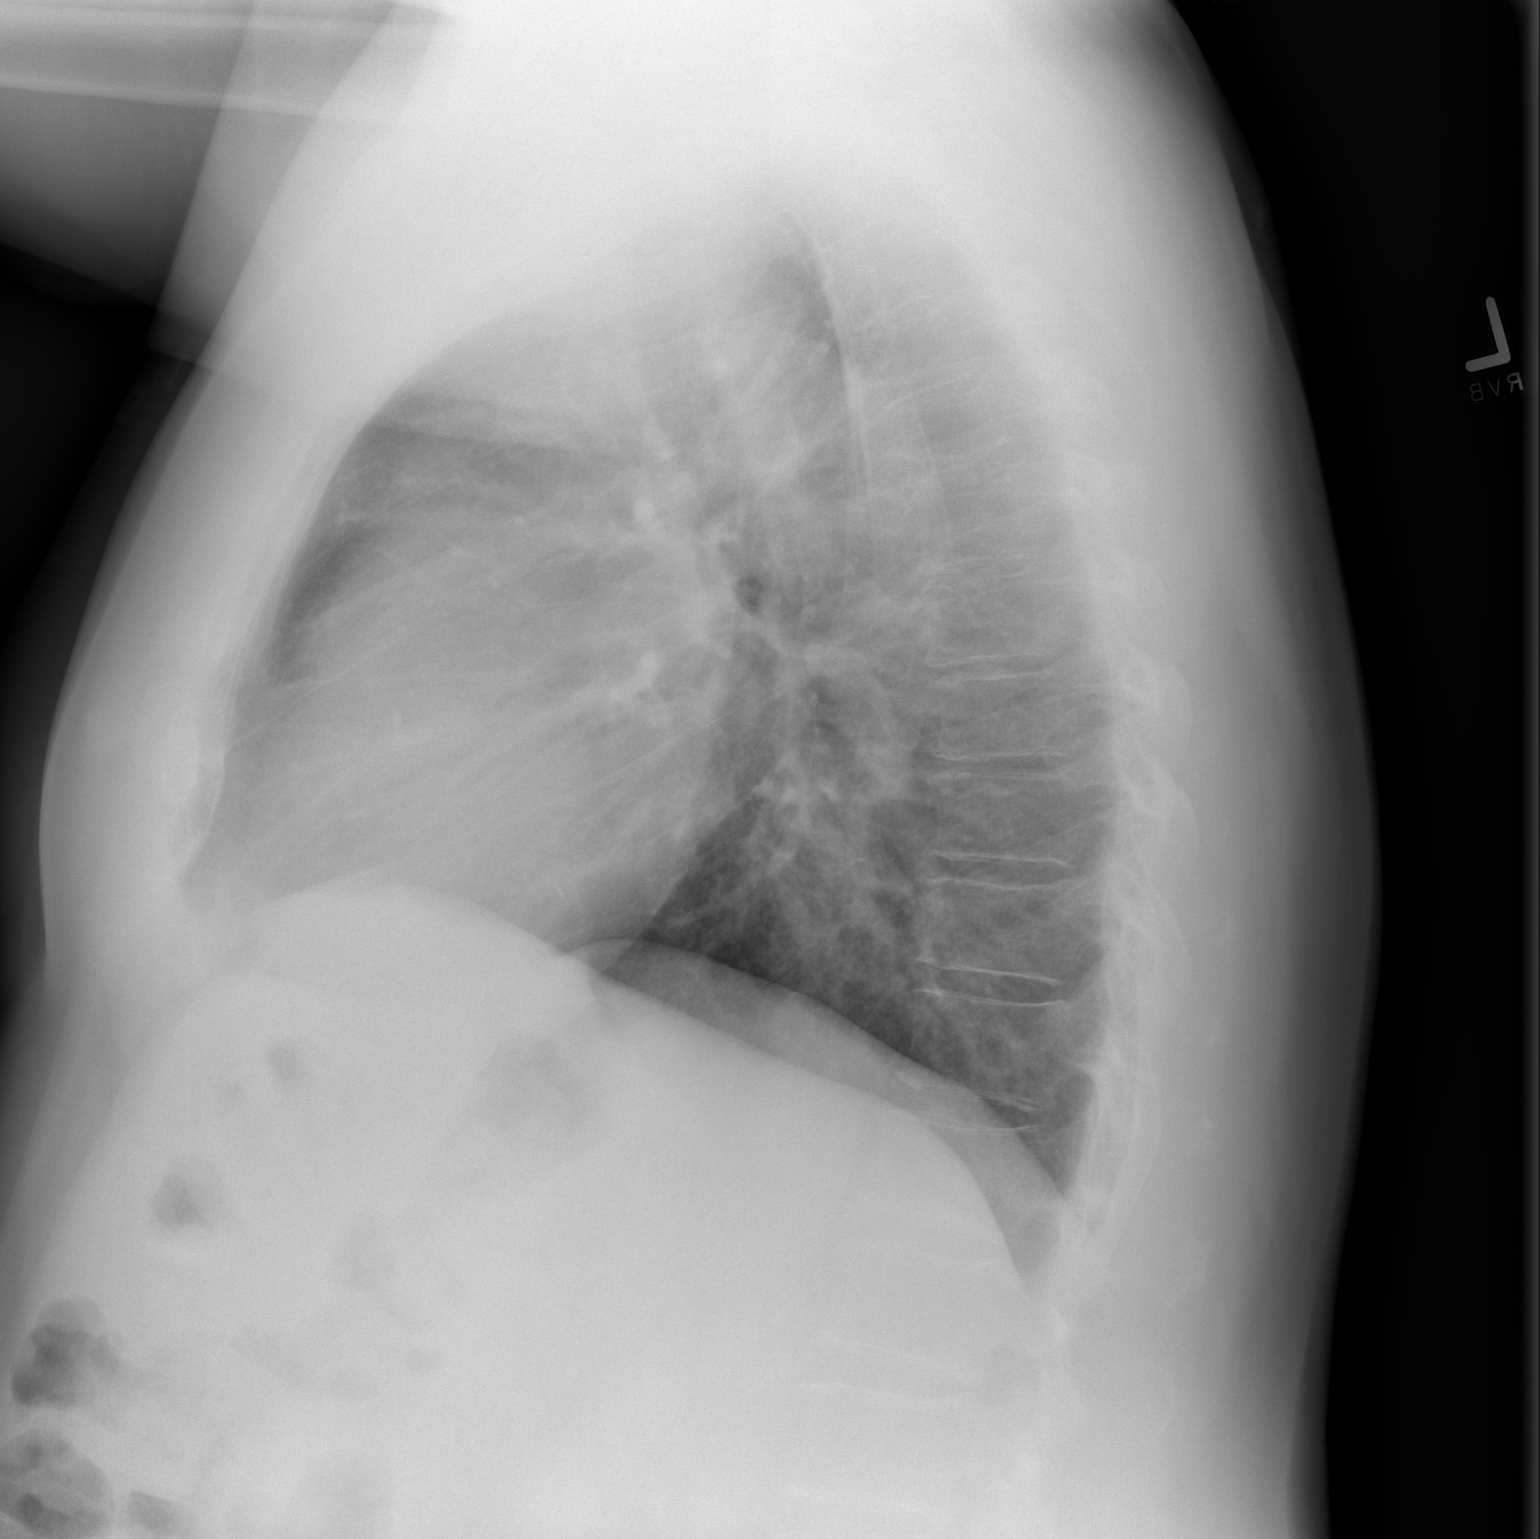

[2 of 2 positions shown; findings below may reference images not displayed]

FINDINGS: The lungs are well-expanded. The interstitial markings are coarse.
There is no alveolar infiltrate or pleural effusion. The heart and
pulmonary vascularity are normal. There is displacement of the
trachea toward the right. There surgical clips at the base of the
neck on the right. There is calcification in the wall of the aortic
arch. The bony thorax exhibits no acute abnormality.
IMPRESSION: No acute pneumonia nor CHF. Mild interstitial prominence bilaterally
may reflect reactive airway disease or chronic bronchitis.

Displacement of the trachea toward the right by soft tissue density
which may reflect interval enlargement of the remaining left thyroid
lobe. Reportedly the patient has undergone previous right
thyroidectomy. Thyroid ultrasound is recommended.

Thoracic aortic atherosclerosis.

## 2019-02-11 IMAGING — RF DG HIP (WITH PELVIS) OPERATIVE*L*
1 series · 2 of 2 positions shown · non-contrast
Comparison: None.

CLINICAL DATA: Left hip arthroplasty.

EXAM:
OPERATIVE LEFT HIP (WITH PELVIS IF PERFORMED) 2 VIEWS
TECHNIQUE: Fluoroscopic spot image(s) were submitted for interpretation
post-operatively.

[Series 1: run · 2 of 2 slices shown]
[im 1/2]
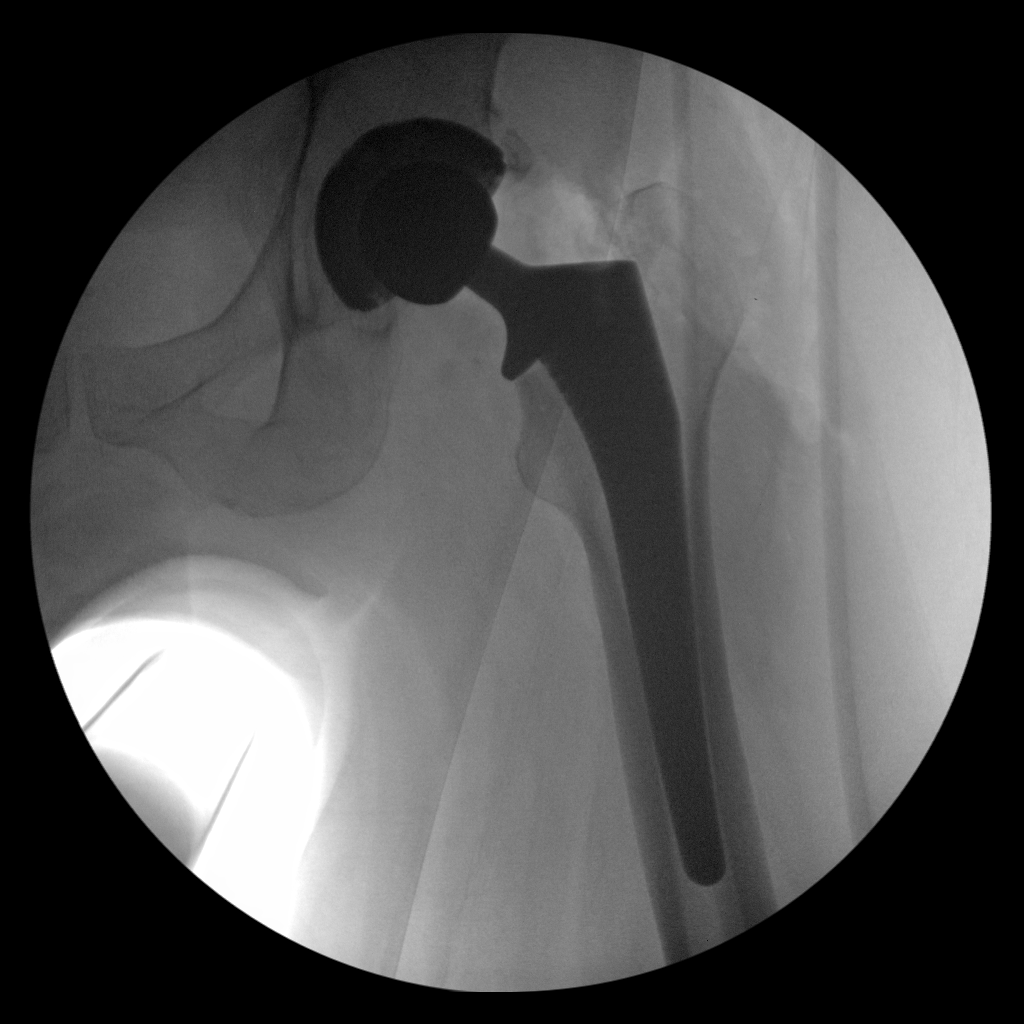
[im 2/2]
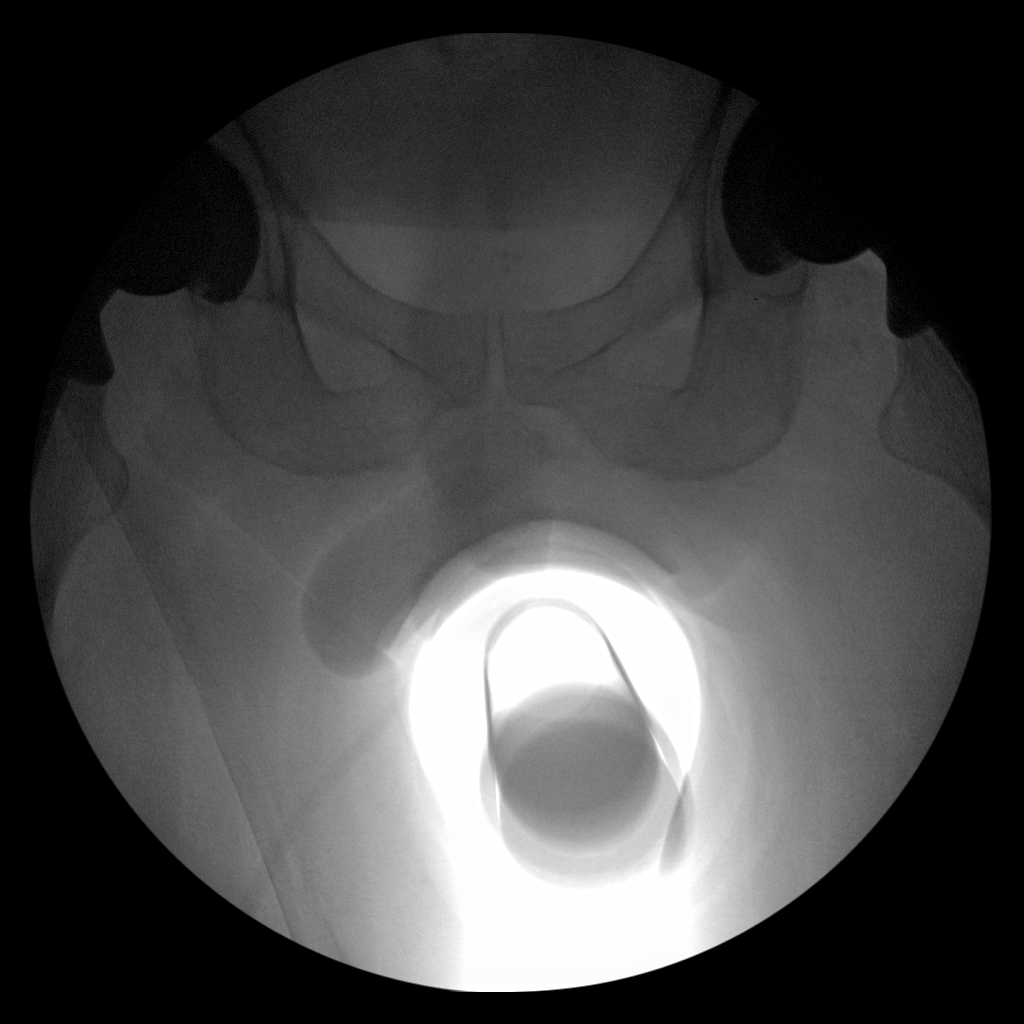

[2 of 2 positions shown; findings below may reference images not displayed]

FINDINGS: Interval left total hip arthroplasty without failure or
complication. Normal alignment. No fracture or dislocation.
IMPRESSION: Interval left total hip arthroplasty.

## 2019-04-23 ENCOUNTER — Telehealth: Payer: Self-pay

## 2019-04-23 NOTE — Telephone Encounter (Signed)
Let him know that there is really nothing that she can do about any type of rib fracture or rib injury other than rest and time.  Ribs will heal on their own but he just takes time.  I am not opposed to him seeing a chiropractor.

## 2019-04-23 NOTE — Telephone Encounter (Signed)
Answered patient with Dr. Eliberto Ivory response on Email

## 2019-04-23 NOTE — Telephone Encounter (Signed)
EMAIL  A couple weeks ago, I was working outside while pushing hard on the left side of my rib area against my large porch post. Then I heard my rib pop, which was accompanied with a lot of pain. I started putting heat on it, started limiting my activities, and started using my Airlife breathing device that I received when I had my hip replacements with Dr. Magnus Ivan. At first it hurt a little to breathe into the device, but now I do it at max performance with little discomfort. At first my regular breathing (no device) was clearly affected by the rib area pain, but now it affects it sporadically---although when I cough or sneeze or yawn it hurts a little. Though it feels better, it still hurts in the rib area WHEN I bend over and pull weeds, for example, or when I sit a lot at my desk, such as I am doing now. 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 When I take ibuprofen, it feels good and there's no pain. Last night there was "little to no pain," and I took nothing.  Since I really do not want to go to the doctor's office due to this "virus mess," I am hoping it will just "heal itself." I have been "told by non-experts" that it sounds like possibly a rib dislocated or cracked.  Anyway, please share with Dr. Kristopher Oppenheim deals with bones and replaced both of my hips this past year---in order to get his response to my hoping it will just "heal itself." Also, there's a chiropractor near my house in [place]. So I am also wondering if Dr. Magnus Ivan thinks this person will be able to help with this. Thanks.  Seth Wood

## 2019-08-14 ENCOUNTER — Telehealth: Payer: Self-pay

## 2019-08-14 NOTE — Telephone Encounter (Signed)
Seth Wood, please ask Dr. Rush Farmer about something for me. My eye has been red for a couple weeks with soreness and pain above it. 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 He said the exact cause is unknown, but medical eye doctors attribute it to an autoimmune disease, such as rheumatoid and psoriatic arthritis (and lupus arthritis). Last year I had both of my hips replaced, x-rays you did on my knees show that my knees are pretty much bone on bone, or there's "no space" (they hurt if I do too much on my legs); and a few years ago when I kept cracking my ribs [place] the beach [place] a board on the waves, my PCP did x-rays on my ribs and said he thinks I have arthritis in those. Therefore especially in light of YOU replacing both of my hips that you said had "a very high stage arthritis" and you x-raying my knees and saying they are in the same condition, can these arthritis cases be an autoimmune disease that could cause this inflammation (episcleritis) of the eye?

## 2019-08-14 NOTE — Telephone Encounter (Signed)
His hip and knee arthritis appears to be degenerative wear-and-tear osteoarthritis and not from an autoimmune process.  However, this can not be ruled out.  He needs to have his PCP refer him to a Rheumatologist for a work up.

## 2019-08-14 NOTE — Telephone Encounter (Signed)
Sent an email to patient

## 2019-11-21 ENCOUNTER — Other Ambulatory Visit: Payer: Self-pay | Admitting: Orthopaedic Surgery

## 2019-11-21 DIAGNOSIS — M79605 Pain in left leg: Secondary | ICD-10-CM
